# Patient Record
Sex: Female | Born: 1995 | Race: Black or African American | Hispanic: No | Marital: Single | Smoking: Never smoker
Health system: Southern US, Community
[De-identification: ages and names within clinical notes are randomized; demographics above are authoritative.]

## PROBLEM LIST (undated history)

## (undated) DIAGNOSIS — L659 Nonscarring hair loss, unspecified: Secondary | ICD-10-CM

## (undated) DIAGNOSIS — D649 Anemia, unspecified: Secondary | ICD-10-CM

## (undated) DIAGNOSIS — K219 Gastro-esophageal reflux disease without esophagitis: Secondary | ICD-10-CM

---
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
## 2014-02-28 ENCOUNTER — Emergency Department (HOSPITAL_BASED_OUTPATIENT_CLINIC_OR_DEPARTMENT_OTHER)
Admission: EM | Admit: 2014-02-28 | Discharge: 2014-02-28 | Disposition: A | Discharge status: Home / Self Care | Payer: BC Managed Care – PPO | Attending: Emergency Medicine | Admitting: Emergency Medicine

## 2014-02-28 ENCOUNTER — Encounter (HOSPITAL_BASED_OUTPATIENT_CLINIC_OR_DEPARTMENT_OTHER): Payer: Self-pay | Admitting: Emergency Medicine

## 2014-02-28 ENCOUNTER — Emergency Department (HOSPITAL_BASED_OUTPATIENT_CLINIC_OR_DEPARTMENT_OTHER): Payer: BC Managed Care – PPO

## 2014-02-28 DIAGNOSIS — R000 Tachycardia, unspecified: Secondary | ICD-10-CM | POA: Insufficient documentation

## 2014-02-28 DIAGNOSIS — J069 Acute upper respiratory infection, unspecified: Secondary | ICD-10-CM | POA: Insufficient documentation

## 2014-02-28 MED ORDER — HYDROCODONE-HOMATROPINE 5-1.5 MG/5ML PO SYRP: 5.0000 mL | ORAL_SOLUTION | Freq: Four times a day (QID) | ORAL | Status: DC | PRN

## 2014-02-28 NOTE — ED Notes (Signed)
Pt complains of itchy throat, cough with sputum production.  Pt complains of sneezing a lot and drainage was green but not clear.  Pt has been taking mucinex.  Pt reports chills.

## 2014-02-28 NOTE — Discharge Instructions (Signed)
Take hycodan as needed for cough. Refer to attached documents for more information. Drink plenty of fluids and get plenty of rest. Return to the ED with worsening or concerning symptoms.

## 2014-02-28 NOTE — ED Provider Notes (Signed)
CSN: [id number]     Arrival date & time 02/28/14  1024 History   First MD Initiated Contact with Patient 02/28/14 1035     Chief Complaint  Patient presents with  . Cough  . Fever     (Consider location/radiation/quality/duration/timing/severity/associated sxs/prior Treatment) Patient is a 18 y.o. female presenting with cough. The history is provided by the patient. No language interpreter was used.  Cough Cough characteristics:  Productive Sputum characteristics:  Nondescript Severity:  Moderate Onset quality:  Gradual Duration:  2 days Timing:  Constant Progression:  Unchanged Chronicity:  New Smoker: no   Context: upper respiratory infection   Context: not animal exposure, not occupational exposure, not sick contacts, not weather changes and not with activity   Relieved by:  Nothing Worsened by:  Nothing tried Ineffective treatments:  Fluids and decongestant Associated symptoms: chills, fever and sore throat   Associated symptoms: no chest pain and no shortness of breath   Fever:    Duration:  2 days   Timing:  Intermittent   Max temp PTA (F):  Unknown   Temp source:  Subjective Sore throat:    Severity:  Moderate   Onset quality:  Gradual   Duration:  2 days   Timing:  Constant   Progression:  Unchanged Risk factors: no chemical exposure, no recent infection and no recent travel     History reviewed. No pertinent past medical history. History reviewed. No pertinent past surgical history. No family history on file. History  Substance Use Topics  . Smoking status: Never Smoker   . Smokeless tobacco: Not on file  . Alcohol Use: No   OB History   Grav Para Term Preterm Abortions TAB SAB Ect Mult Living                 Review of Systems  Constitutional: Positive for fever and chills. Negative for fatigue.  HENT: Positive for sore throat. Negative for trouble swallowing.   Eyes: Negative for visual disturbance.  Respiratory: Positive for cough. Negative for  shortness of breath.   Cardiovascular: Negative for chest pain and palpitations.  Gastrointestinal: Negative for nausea, vomiting, abdominal pain and diarrhea.  Genitourinary: Negative for dysuria and difficulty urinating.  Musculoskeletal: Negative for arthralgias and neck pain.  Skin: Negative for color change.  Neurological: Negative for dizziness and weakness.  Psychiatric/Behavioral: Negative for dysphoric mood.      Allergies  Ciprofloxacin  Home Medications   Current Outpatient Rx  Name  Route  Sig  Dispense  Refill  . diphenhydrAMINE (BENADRYL) 25 mg capsule   Oral   Take 50 mg by mouth every 6 (six) hours as needed. For itching          BP 118/75  Pulse 105  Temp(Src) 99.1 F (37.3 C) (Oral)  Resp 14  Ht 5\' 4"  (1.626 m)  Wt 131 lb (59.421 kg)  BMI 22.47 kg/m2  SpO2 100%  LMP 02/21/2014 Physical Exam  Nursing note and vitals reviewed. Constitutional: She is oriented to person, place, and time. She appears well-developed and well-nourished. No distress.  HENT:  Head: Normocephalic and atraumatic.  Mouth/Throat: Oropharynx is clear and moist. No oropharyngeal exudate.  Eyes: Conjunctivae and EOM are normal. Pupils are equal, round, and reactive to light.  Neck: Normal range of motion.  Cardiovascular: Normal rate and regular rhythm.  Exam reveals no gallop and no friction rub.   No murmur heard. Pulmonary/Chest: Effort normal and breath sounds normal. She has no wheezes. She has  no rales. She exhibits no tenderness.  Abdominal: Soft. She exhibits no distension. There is no tenderness. There is no rebound.  Musculoskeletal: Normal range of motion.  Neurological: She is alert and oriented to person, place, and time. Coordination normal.  Speech is goal-oriented. Moves limbs without ataxia.   Skin: Skin is warm and dry.  Psychiatric: She has a normal mood and affect. Her behavior is normal.    ED Course  Procedures (including critical care time) Labs  Review Labs Reviewed - No data to display Imaging Review Dg Chest 2 View  02/28/2014   CLINICAL DATA:  Cough and fever  EXAM: CHEST  2 VIEW  COMPARISON:  None.  FINDINGS: The heart size and mediastinal contours are within normal limits. Both lungs are clear. The visualized skeletal structures are unremarkable.  IMPRESSION: No active cardiopulmonary disease.   Electronically Signed   By: Marlan Palau M.D.   On: 02/28/2014 10:57     EKG Interpretation None      MDM   Final diagnoses:  URI (upper respiratory infection)    10:57 AM Patient's chest xray pending. Patient is mildly tachycardic and afebrile.   11:07 AM Patient's chest xray unremarkable for acute changes. Patient likely has URI. Patient will be discharged with hycodan for cough. Patient advised to take ibuprofen or tylenol for body aches and fever. Patient advised to drink plenty of fluids.  Emilia Beck, [place] 03/01/14 [id number]

## 2014-03-01 NOTE — ED Provider Notes (Signed)
Medical screening examination/treatment/procedure(s) were performed by non-physician practitioner and as supervising physician I was immediately available for consultation/collaboration.     Nitara Szczerba, MD 03/01/14 0808 

## 2015-05-14 ENCOUNTER — Emergency Department (HOSPITAL_BASED_OUTPATIENT_CLINIC_OR_DEPARTMENT_OTHER)
Admission: EM | Admit: 2015-05-14 | Discharge: 2015-05-14 | Disposition: A | Discharge status: Home / Self Care | Payer: BLUE CROSS/BLUE SHIELD | Attending: Emergency Medicine | Admitting: Emergency Medicine

## 2015-05-14 ENCOUNTER — Encounter (HOSPITAL_BASED_OUTPATIENT_CLINIC_OR_DEPARTMENT_OTHER): Payer: Self-pay | Admitting: *Deleted

## 2015-05-14 DIAGNOSIS — N3000 Acute cystitis without hematuria: Secondary | ICD-10-CM | POA: Diagnosis not present

## 2015-05-14 DIAGNOSIS — Z3202 Encounter for pregnancy test, result negative: Secondary | ICD-10-CM | POA: Insufficient documentation

## 2015-05-14 DIAGNOSIS — R1084 Generalized abdominal pain: Secondary | ICD-10-CM | POA: Diagnosis present

## 2015-05-14 LAB — URINALYSIS, ROUTINE W REFLEX MICROSCOPIC
Bilirubin Urine: NEGATIVE
GLUCOSE, UA: NEGATIVE mg/dL
Ketones, ur: 15 mg/dL — AB
NITRITE: NEGATIVE
PH: 5.5 (ref 5.0–8.0)
Protein, ur: 30 mg/dL — AB
SPECIFIC GRAVITY, URINE: 1.034 — AB (ref 1.005–1.030)
Urobilinogen, UA: 0.2 mg/dL (ref 0.0–1.0)

## 2015-05-14 LAB — URINE MICROSCOPIC-ADD ON

## 2015-05-14 LAB — PREGNANCY, URINE: Preg Test, Ur: NEGATIVE

## 2015-05-14 MED ORDER — SULFAMETHOXAZOLE-TRIMETHOPRIM 800-160 MG PO TABS: 1.0000 | ORAL_TABLET | Freq: Once | ORAL | Status: DC

## 2015-05-14 MED ORDER — SULFAMETHOXAZOLE-TRIMETHOPRIM 800-160 MG PO TABS: 1.0000 | ORAL_TABLET | Freq: Two times a day (BID) | ORAL | Status: AC

## 2015-05-14 NOTE — Discharge Instructions (Signed)

## 2015-05-14 NOTE — ED Provider Notes (Signed)
CSN: [id number]     Arrival date & time 05/14/15  1154 History   First MD Initiated Contact with Patient 05/14/15 1157     Chief Complaint  Patient presents with  . Abdominal Pain     (Consider location/radiation/quality/duration/timing/severity/associated sxs/prior Treatment) Patient is a 19 y.o. female presenting with abdominal pain. The history is provided by the patient.  Abdominal Pain Pain location:  Generalized Pain quality: cramping   Pain radiates to:  Does not radiate Pain severity:  Mild Onset quality:  Gradual Duration:  1 week Timing:  Intermittent Progression:  Waxing and waning Chronicity:  New Context: not recent illness, not sick contacts and not trauma   Relieved by:  Nothing Worsened by:  Nothing tried Associated symptoms: flatus and vaginal bleeding (spotting, LMP 6 weeks ago. Normally has regular periods)   Associated symptoms: no cough, no diarrhea, no fever, no hematemesis, no hematochezia, no melena, no nausea, no shortness of breath and no vomiting     History reviewed. No pertinent past medical history. History reviewed. No pertinent past surgical history. History reviewed. No pertinent family history. History  Substance Use Topics  . Smoking status: Never Smoker   . Smokeless tobacco: Not on file  . Alcohol Use: No   OB History    No data available     Review of Systems  Constitutional: Negative for fever.  Respiratory: Negative for cough and shortness of breath.   Gastrointestinal: Positive for abdominal pain and flatus. Negative for nausea, vomiting, diarrhea, melena, hematochezia and hematemesis.  Genitourinary: Positive for vaginal bleeding (spotting, LMP 6 weeks ago. Normally has regular periods).  All other systems reviewed and are negative.     Allergies  Ciprofloxacin  Home Medications   Prior to Admission medications   Not on File   BP 127/83 mmHg  Pulse 105  Temp(Src) 99.1 F (37.3 C)  Resp 16  SpO2 100%  LMP  04/07/2015 Physical Exam  Constitutional: She is oriented to person, place, and time. She appears well-developed and well-nourished. No distress.  HENT:  Head: Normocephalic and atraumatic.  Mouth/Throat: Oropharynx is clear and moist.  Eyes: EOM are normal. Pupils are equal, round, and reactive to light.  Neck: Normal range of motion. Neck supple.  Cardiovascular: Normal rate and regular rhythm.  Exam reveals no friction rub.   No murmur heard. Pulmonary/Chest: Effort normal and breath sounds normal. No respiratory distress. She has no wheezes. She has no rales.  Abdominal: Soft. She exhibits no distension. There is no tenderness. There is no rebound.  Musculoskeletal: Normal range of motion. She exhibits no edema.  Neurological: She is alert and oriented to person, place, and time.  Skin: No rash noted. She is not diaphoretic.  Nursing note and vitals reviewed.   ED Course  Procedures (including critical care time) Labs Review Labs Reviewed  URINALYSIS, ROUTINE W REFLEX MICROSCOPIC (NOT AT [facility]) - Abnormal; Notable for the following:    APPearance TURBID (*)    Specific Gravity, Urine 1.034 (*)    Hgb urine dipstick LARGE (*)    Ketones, ur 15 (*)    Protein, ur 30 (*)    Leukocytes, UA LARGE (*)    All other components within normal limits  URINE MICROSCOPIC-ADD ON - Abnormal; Notable for the following:    Squamous Epithelial / LPF MANY (*)    Bacteria, UA MANY (*)    All other components within normal limits  PREGNANCY, URINE    Imaging Review No results found.  EKG Interpretation None      MDM   Final diagnoses:  Acute cystitis without hematuria    43F here with abdominal pain. Mild, diffuse, crampy. Waxing and waning, but is having pain daily. No fevers, vomiting, diarrhea, dysuria. Mild spotting. LMP 6 weeks ago, she is late. Normally has regular periods. AFVSS here. Mild central abdominal pain. No rebound or guarding. I discussed CT benefits vs risks, she  is open to scanning and would like to proceed. I feel this is reasonable.   Urine shows UTI. Discussed holding on scan since found a source. Will plan on antibiotics and f/u.  Elwin MochaBlair Darby Shadwick, MD 05/14/15 1321

## 2015-05-14 NOTE — ED Notes (Signed)
Pt c/o diffuse abd pain x 1 week denies n/v/d

## 2015-05-14 NOTE — ED Notes (Signed)
MD at bedside. 

## 2015-07-04 ENCOUNTER — Emergency Department (HOSPITAL_BASED_OUTPATIENT_CLINIC_OR_DEPARTMENT_OTHER)
Admission: EM | Admit: 2015-07-04 | Discharge: 2015-07-04 | Disposition: A | Discharge status: Home / Self Care | Payer: BLUE CROSS/BLUE SHIELD | Attending: Emergency Medicine | Admitting: Emergency Medicine

## 2015-07-04 ENCOUNTER — Emergency Department (HOSPITAL_BASED_OUTPATIENT_CLINIC_OR_DEPARTMENT_OTHER): Payer: BLUE CROSS/BLUE SHIELD

## 2015-07-04 ENCOUNTER — Inpatient Hospital Stay (HOSPITAL_COMMUNITY): Admission: RE | Admit: 2015-07-04 | Payer: Self-pay | Source: Ambulatory Visit

## 2015-07-04 ENCOUNTER — Encounter (HOSPITAL_BASED_OUTPATIENT_CLINIC_OR_DEPARTMENT_OTHER): Payer: Self-pay

## 2015-07-04 DIAGNOSIS — B373 Candidiasis of vulva and vagina: Secondary | ICD-10-CM | POA: Diagnosis not present

## 2015-07-04 DIAGNOSIS — R1032 Left lower quadrant pain: Secondary | ICD-10-CM

## 2015-07-04 DIAGNOSIS — D500 Iron deficiency anemia secondary to blood loss (chronic): Secondary | ICD-10-CM | POA: Diagnosis not present

## 2015-07-04 DIAGNOSIS — Z3202 Encounter for pregnancy test, result negative: Secondary | ICD-10-CM | POA: Insufficient documentation

## 2015-07-04 DIAGNOSIS — R109 Unspecified abdominal pain: Secondary | ICD-10-CM | POA: Diagnosis present

## 2015-07-04 DIAGNOSIS — B3731 Acute candidiasis of vulva and vagina: Secondary | ICD-10-CM

## 2015-07-04 DIAGNOSIS — N12 Tubulo-interstitial nephritis, not specified as acute or chronic: Secondary | ICD-10-CM

## 2015-07-04 LAB — CBC
HCT: 24.3 % — ABNORMAL LOW (ref 36.0–46.0)
HEMOGLOBIN: 6.6 g/dL — AB (ref 12.0–15.0)
MCH: 17.8 pg — AB (ref 26.0–34.0)
MCHC: 27.2 g/dL — ABNORMAL LOW (ref 30.0–36.0)
MCV: 65.5 fL — ABNORMAL LOW (ref 78.0–100.0)
PLATELETS: 446 10*3/uL — AB (ref 150–400)
RBC: 3.71 MIL/uL — AB (ref 3.87–5.11)
RDW: 18.4 % — AB (ref 11.5–15.5)
WBC: 5.9 10*3/uL (ref 4.0–10.5)

## 2015-07-04 LAB — COMPREHENSIVE METABOLIC PANEL
ALK PHOS: 47 U/L (ref 38–126)
ALT: 25 U/L (ref 14–54)
ANION GAP: 6 (ref 5–15)
AST: 30 U/L (ref 15–41)
Albumin: 3.7 g/dL (ref 3.5–5.0)
BILIRUBIN TOTAL: 0.4 mg/dL (ref 0.3–1.2)
BUN: 13 mg/dL (ref 6–20)
CO2: 23 mmol/L (ref 22–32)
Calcium: 8.6 mg/dL — ABNORMAL LOW (ref 8.9–10.3)
Chloride: 109 mmol/L (ref 101–111)
Creatinine, Ser: 0.75 mg/dL (ref 0.44–1.00)
Glucose, Bld: 126 mg/dL — ABNORMAL HIGH (ref 65–99)
POTASSIUM: 3.3 mmol/L — AB (ref 3.5–5.1)
Sodium: 138 mmol/L (ref 135–145)
Total Protein: 7 g/dL (ref 6.5–8.1)

## 2015-07-04 LAB — URINALYSIS, ROUTINE W REFLEX MICROSCOPIC
BILIRUBIN URINE: NEGATIVE
GLUCOSE, UA: NEGATIVE mg/dL
Ketones, ur: NEGATIVE mg/dL
Nitrite: NEGATIVE
PROTEIN: 30 mg/dL — AB
SPECIFIC GRAVITY, URINE: 1.026 (ref 1.005–1.030)
Urobilinogen, UA: 0.2 mg/dL (ref 0.0–1.0)
pH: 5 (ref 5.0–8.0)

## 2015-07-04 LAB — URINE MICROSCOPIC-ADD ON

## 2015-07-04 LAB — PREGNANCY, URINE: PREG TEST UR: NEGATIVE

## 2015-07-04 LAB — LIPASE, BLOOD: Lipase: 19 U/L — ABNORMAL LOW (ref 22–51)

## 2015-07-04 LAB — WET PREP, GENITAL: Trich, Wet Prep: NONE SEEN

## 2015-07-04 MED ORDER — ONDANSETRON HCL 4 MG/2ML IJ SOLN
4.0000 mg | Freq: Once | INTRAMUSCULAR | Status: AC
  Administered 2015-07-04: 4 mg via INTRAVENOUS
  Filled 2015-07-04: qty 2

## 2015-07-04 MED ORDER — CEFTRIAXONE SODIUM 1 G IJ SOLR
INTRAMUSCULAR | Status: AC
  Filled 2015-07-04: qty 10

## 2015-07-04 MED ORDER — FENTANYL CITRATE (PF) 100 MCG/2ML IJ SOLN
100.0000 ug | Freq: Once | INTRAMUSCULAR | Status: AC
  Administered 2015-07-04: 100 ug via INTRAVENOUS
  Filled 2015-07-04: qty 2

## 2015-07-04 MED ORDER — DEXTROSE 5 % IV SOLN
1.0000 g | Freq: Once | INTRAVENOUS | Status: AC
  Administered 2015-07-04: 1 g via INTRAVENOUS

## 2015-07-04 MED ORDER — FLUCONAZOLE 150 MG PO TABS: ORAL_TABLET | ORAL | Status: DC

## 2015-07-04 MED ORDER — FERROUS SULFATE 325 (65 FE) MG PO TABS: 325.0000 mg | ORAL_TABLET | Freq: Every day | ORAL | Status: DC

## 2015-07-04 MED ORDER — FLUCONAZOLE 50 MG PO TABS
150.0000 mg | ORAL_TABLET | Freq: Once | ORAL | Status: AC
  Administered 2015-07-04: 150 mg via ORAL
  Filled 2015-07-04 (×2): qty 1

## 2015-07-04 MED ORDER — CEPHALEXIN 500 MG PO CAPS: 500.0000 mg | ORAL_CAPSULE | Freq: Three times a day (TID) | ORAL | Status: DC

## 2015-07-04 NOTE — ED Notes (Signed)
Pt c/o lt sided abdominal pain x39mins with vomiting x3;

## 2015-07-04 NOTE — ED Provider Notes (Signed)
CSN: [id number]     Arrival date & time 07/04/15  0317 History   First MD Initiated Contact with Patient 07/04/15 0445     Chief Complaint  Patient presents with  . Abdominal Pain     (Consider location/radiation/quality/duration/timing/severity/associated sxs/prior Treatment) HPI  This is a 19 year old female who awoke about 3 AM with severe, sharp left-sided abdominal pain. It is been associated with several episodes of emesis. There is some change with movement or palpation but not with breathing. She denies fever, chills, diarrhea, shortness of breath, dysuria, hematuria, vaginal bleeding or vaginal discharge. She does have a history of heavy periods and states she eats a lot of ice. She is sexually active but does not use any form of birth control other than condoms.  History reviewed. No pertinent past medical history. History reviewed. No pertinent past surgical history. No family history on file. History  Substance Use Topics  . Smoking status: Never Smoker   . Smokeless tobacco: Not on file  . Alcohol Use: No   OB History    No data available     Review of Systems  All other systems reviewed and are negative.   Allergies  Ciprofloxacin  Home Medications   Prior to Admission medications   Not on File   BP 116/63 mmHg  Pulse 74  Temp(Src) 98 F (36.7 C) (Oral)  Resp 18  Ht 5\' 5"  (1.651 m)  Wt 130 lb (58.968 kg)  BMI 21.63 kg/m2  SpO2 98%  LMP 06/20/2015   Physical Exam General: Well-developed, well-nourished female in no acute distress; appearance consistent with age of record HENT: normocephalic; atraumatic Eyes: pupils equal, round and reactive to light; extraocular muscles intact Neck: supple Heart: regular rate and rhythm Lungs: clear to auscultation bilaterally Abdomen: soft; nondistended; equivocal left lower quadrant; no masses or hepatosplenomegaly; bowel sounds present GU: Normal external genitalia; no vaginal bleeding; mucoid discharge per  cervical os; no cervical motion tenderness; no adnexal tenderness Extremities: No deformity; full range of motion; pulses normal Neurologic: Awake, alert and oriented; motor function intact in all extremities and symmetric; no facial droop Skin: Warm and dry Psychiatric: Normal mood and affect    ED Course  Procedures (including critical care time)   MDM   Nursing notes and vitals signs, including pulse oximetry, reviewed.  Summary of this visit's results, reviewed by myself:  Labs:  Results for orders placed or performed during the hospital encounter of 07/04/15 (from the past 24 hour(s))  Lipase, blood     Status: Abnormal   Collection Time: 07/04/15  4:00 AM  Result Value Ref Range   Lipase 19 (L) 22 - 51 U/L  Comprehensive metabolic panel     Status: Abnormal   Collection Time: 07/04/15  4:00 AM  Result Value Ref Range   Sodium 138 135 - 145 mmol/L   Potassium 3.3 (L) 3.5 - 5.1 mmol/L   Chloride 109 101 - 111 mmol/L   CO2 23 22 - 32 mmol/L   Glucose, Bld 126 (H) 65 - 99 mg/dL   BUN 13 6 - 20 mg/dL   Creatinine, Ser 7.82 0.44 - 1.00 mg/dL   Calcium 8.6 (L) 8.9 - 10.3 mg/dL   Total Protein 7.0 6.5 - 8.1 g/dL   Albumin 3.7 3.5 - 5.0 g/dL   AST 30 15 - 41 U/L   ALT 25 14 - 54 U/L   Alkaline Phosphatase 47 38 - 126 U/L   Total Bilirubin 0.4 0.3 - 1.2 mg/dL  GFR calc non Af Amer >60 >60 mL/min   GFR calc Af Amer >60 >60 mL/min   Anion gap 6 5 - 15  CBC     Status: Abnormal   Collection Time: 07/04/15  4:00 AM  Result Value Ref Range   WBC 5.9 4.0 - 10.5 K/uL   RBC 3.71 (L) 3.87 - 5.11 MIL/uL   Hemoglobin 6.6 (LL) 12.0 - 15.0 g/dL   HCT 29.5 (L) 62.1 - 30.8 %   MCV 65.5 (L) 78.0 - 100.0 fL   MCH 17.8 (L) 26.0 - 34.0 pg   MCHC 27.2 (L) 30.0 - 36.0 g/dL   RDW 65.7 (H) 84.6 - 96.2 %   Platelets 446 (H) 150 - 400 K/uL  Urinalysis, Routine w reflex microscopic (not at [facility])     Status: Abnormal   Collection Time: 07/04/15  4:00 AM  Result Value Ref Range   Color,  Urine YELLOW YELLOW   APPearance TURBID (A) CLEAR   Specific Gravity, Urine 1.026 1.005 - 1.030   pH 5.0 5.0 - 8.0   Glucose, UA NEGATIVE NEGATIVE mg/dL   Hgb urine dipstick LARGE (A) NEGATIVE   Bilirubin Urine NEGATIVE NEGATIVE   Ketones, ur NEGATIVE NEGATIVE mg/dL   Protein, ur 30 (A) NEGATIVE mg/dL   Urobilinogen, UA 0.2 0.0 - 1.0 mg/dL   Nitrite NEGATIVE NEGATIVE   Leukocytes, UA MODERATE (A) NEGATIVE  Pregnancy, urine     Status: None   Collection Time: 07/04/15  4:00 AM  Result Value Ref Range   Preg Test, Ur NEGATIVE NEGATIVE  Urine microscopic-add on     Status: Abnormal   Collection Time: 07/04/15  4:00 AM  Result Value Ref Range   Squamous Epithelial / LPF MANY (A) RARE   WBC, UA 21-50 <3 WBC/hpf   RBC / HPF 7-10 <3 RBC/hpf   Bacteria, UA MANY (A) RARE   Urine-Other MUCOUS PRESENT   Wet prep, genital     Status: Abnormal   Collection Time: 07/04/15  5:12 AM  Result Value Ref Range   Yeast Wet Prep HPF POC FEW (A) NONE SEEN   Trich, Wet Prep NONE SEEN NONE SEEN   Clue Cells Wet Prep HPF POC FEW (A) NONE SEEN   WBC, Wet Prep HPF POC TOO NUMEROUS TO COUNT (A) NONE SEEN    Imaging Studies: Ct Renal Stone Study  07/04/2015   CLINICAL DATA:  Initial evaluation for acute left-sided abdominal pain, nausea, vomiting, hematuria.  EXAM: CT ABDOMEN AND PELVIS WITHOUT CONTRAST  TECHNIQUE: Multidetector CT imaging of the abdomen and pelvis was performed following the standard protocol without IV contrast.  COMPARISON:  None.  FINDINGS: Visualized lung bases are clear. Diffusely decreased density within the cardiac blood pole, suggesting anemia.  Liver demonstrates a normal unenhanced appearance. Gallbladder within normal limits. No biliary dilatation. Spleen, adrenal glands, and pancreas demonstrate a normal unenhanced appearance.  Kidneys are equal in size without evidence of nephrolithiasis or hydronephrosis. Faint punctate hyperdensity within the renal papilla favored to be related  to dehydration. No radiopaque calculi seen along the course of either renal collecting system. There is a 4 mm calcification along the posterior aspect of the bladder, which may reflect a passed stone (Series 2, image 66). This is not entirely certain, and this may reflect an adjacent vascular phlebolith.  Bladder within normal limits. No evidence for bowel obstruction. No acute inflammatory changes seen about the bowels. Appendix is normal.  Bladder within normal limits. Fluid present within the  endometrial canal. Uterus and ovaries are otherwise within normal limits.  No free air or fluid.  No adenopathy.  No acute osseous abnormality. No worrisome lytic or blastic osseous lesions.  IMPRESSION: 1. Single 4 mm calcific density layering along the right posterior aspect of the bladder lumen. It is unclear whether this reflects a stone layering within the bladder lumen or an adjacent vascular phlebolith. There is no significant hydronephrosis or hydroureter within either renal collecting system, which would argue against this being a recently passed stone. Correlation with urinalysis and symptomatology recommended. 2. No other definite radiopaque calculi identified. 3. No other acute intra-abdominal or pelvic process. 4. Decreased density within the cardiac blood pool, suggesting anemia.   Electronically Signed   By: Rise Mu M.D.   On: 07/04/2015 06:20   6:31 AM Suspect early pyelonephritis. Rocephin given IV in ED. Diflucan given for vaginal candidiasis. Will start on iron and refer to [facility] clinic for treatment of her heavy menses and consequent microcytic anemia.      Paula Libra, MD 07/04/15 [phone]

## 2015-07-05 LAB — GC/CHLAMYDIA PROBE AMP (~~LOC~~) NOT AT ARMC
CHLAMYDIA, DNA PROBE: POSITIVE — AB
NEISSERIA GONORRHEA: NEGATIVE

## 2015-07-05 LAB — HIV ANTIBODY (ROUTINE TESTING W REFLEX): HIV Screen 4th Generation wRfx: NONREACTIVE

## 2015-07-06 LAB — URINE CULTURE

## 2015-07-08 ENCOUNTER — Telehealth: Payer: Self-pay | Admitting: *Deleted

## 2015-07-08 NOTE — ED Notes (Signed)
(+)  urine culture, treated with Cephalexin, OK per pharm 

## 2015-08-29 ENCOUNTER — Emergency Department (HOSPITAL_BASED_OUTPATIENT_CLINIC_OR_DEPARTMENT_OTHER)
Admission: EM | Admit: 2015-08-29 | Discharge: 2015-08-29 | Disposition: A | Discharge status: Home / Self Care | Payer: BLUE CROSS/BLUE SHIELD | Attending: Emergency Medicine | Admitting: Emergency Medicine

## 2015-08-29 ENCOUNTER — Encounter (HOSPITAL_BASED_OUTPATIENT_CLINIC_OR_DEPARTMENT_OTHER): Payer: Self-pay | Admitting: Emergency Medicine

## 2015-08-29 DIAGNOSIS — Z862 Personal history of diseases of the blood and blood-forming organs and certain disorders involving the immune mechanism: Secondary | ICD-10-CM | POA: Insufficient documentation

## 2015-08-29 DIAGNOSIS — J069 Acute upper respiratory infection, unspecified: Secondary | ICD-10-CM | POA: Diagnosis not present

## 2015-08-29 DIAGNOSIS — M791 Myalgia: Secondary | ICD-10-CM | POA: Diagnosis not present

## 2015-08-29 DIAGNOSIS — J309 Allergic rhinitis, unspecified: Secondary | ICD-10-CM | POA: Insufficient documentation

## 2015-08-29 DIAGNOSIS — R0981 Nasal congestion: Secondary | ICD-10-CM | POA: Diagnosis present

## 2015-08-29 HISTORY — DX: Anemia, unspecified: D64.9

## 2015-08-29 MED ORDER — NAPROXEN 250 MG PO TABS: 250.0000 mg | ORAL_TABLET | Freq: Two times a day (BID) | ORAL | Status: DC

## 2015-08-29 MED ORDER — CETIRIZINE HCL 10 MG PO TABS: 10.0000 mg | ORAL_TABLET | Freq: Every day | ORAL | Status: DC

## 2015-08-29 MED ORDER — BENZONATATE 100 MG PO CAPS: 100.0000 mg | ORAL_CAPSULE | Freq: Three times a day (TID) | ORAL | Status: DC

## 2015-08-29 MED ORDER — FLUTICASONE PROPIONATE 50 MCG/ACT NA SUSP: 2.0000 | Freq: Every day | NASAL | Status: DC

## 2015-08-29 NOTE — ED Notes (Signed)
PA at bedside.

## 2015-08-29 NOTE — Discharge Instructions (Signed)
Upper Respiratory Infection, Adult An upper respiratory infection (URI) is also sometimes known as the common cold. The upper respiratory tract includes the nose, sinuses, throat, trachea, and bronchi. Bronchi are the airways leading to the lungs. Most people improve within 1 week, but symptoms can last up to 2 weeks. A residual cough may last even longer.  CAUSES Many different viruses can infect the tissues lining the upper respiratory tract. The tissues become irritated and inflamed and often become very moist. Mucus production is also common. A cold is contagious. You can easily spread the virus to others by oral contact. This includes kissing, sharing a glass, coughing, or sneezing. Touching your mouth or nose and then touching a surface, which is then touched by another person, can also spread the virus. SYMPTOMS  Symptoms typically develop 1 to 3 days after you come in contact with a cold virus. Symptoms vary from person to person. They may include:  Runny nose.  Sneezing.  Nasal congestion.  Sinus irritation.  Sore throat.  Loss of voice (laryngitis).  Cough.  Fatigue.  Muscle aches.  Loss of appetite.  Headache.  Low-grade fever. DIAGNOSIS  You might diagnose your own cold based on familiar symptoms, since most people get a cold 2 to 3 times a year. Your caregiver can confirm this based on your exam. Most importantly, your caregiver can check that your symptoms are not due to another disease such as strep throat, sinusitis, pneumonia, asthma, or epiglottitis. Blood tests, throat tests, and X-rays are not necessary to diagnose a common cold, but they may sometimes be helpful in excluding other more serious diseases. Your caregiver will decide if any further tests are required. RISKS AND COMPLICATIONS  You may be at risk for a more severe case of the common cold if you smoke cigarettes, have chronic heart disease (such as heart failure) or lung disease (such as asthma), or if  you have a weakened immune system. The very young and very old are also at risk for more serious infections. Bacterial sinusitis, middle ear infections, and bacterial pneumonia can complicate the common cold. The common cold can worsen asthma and chronic obstructive pulmonary disease (COPD). Sometimes, these complications can require emergency medical care and may be life-threatening. PREVENTION  The best way to protect against getting a cold is to practice good hygiene. Avoid oral or hand contact with people with cold symptoms. Wash your hands often if contact occurs. There is no clear evidence that vitamin C, vitamin E, echinacea, or exercise reduces the chance of developing a cold. However, it is always recommended to get plenty of rest and practice good nutrition. TREATMENT  Treatment is directed at relieving symptoms. There is no cure. Antibiotics are not effective, because the infection is caused by a virus, not by bacteria. Treatment may include:  Increased fluid intake. Sports drinks offer valuable electrolytes, sugars, and fluids.  Breathing heated mist or steam (vaporizer or shower).  Eating chicken soup or other clear broths, and maintaining good nutrition.  Getting plenty of rest.  Using gargles or lozenges for comfort.  Controlling fevers with ibuprofen or acetaminophen as directed by your caregiver.  Increasing usage of your inhaler if you have asthma. Zinc gel and zinc lozenges, taken in the first 24 hours of the common cold, can shorten the duration and lessen the severity of symptoms. Pain medicines may help with fever, muscle aches, and throat pain. A variety of non-prescription medicines are available to treat congestion and runny nose. Your caregiver   can make recommendations and may suggest nasal or lung inhalers for other symptoms.  HOME CARE INSTRUCTIONS   Only take over-the-counter or prescription medicines for pain, discomfort, or fever as directed by your  caregiver.  Use a warm mist humidifier or inhale steam from a shower to increase air moisture. This may keep secretions moist and make it easier to breathe.  Drink enough water and fluids to keep your urine clear or pale yellow.  Rest as needed.  Return to work when your temperature has returned to normal or as your caregiver advises. You may need to stay home longer to avoid infecting others. You can also use a face mask and careful hand washing to prevent spread of the virus. SEEK MEDICAL CARE IF:   After the first few days, you feel you are getting worse rather than better.  You need your caregiver's advice about medicines to control symptoms.  You develop chills, worsening shortness of breath, or brown or red sputum. These may be signs of pneumonia.  You develop yellow or brown nasal discharge or pain in the face, especially when you bend forward. These may be signs of sinusitis.  You develop a fever, swollen neck glands, pain with swallowing, or white areas in the back of your throat. These may be signs of strep throat. SEEK IMMEDIATE MEDICAL CARE IF:   You have a fever.  You develop severe or persistent headache, ear pain, sinus pain, or chest pain.  You develop wheezing, a prolonged cough, cough up blood, or have a change in your usual mucus (if you have chronic lung disease).  You develop sore muscles or a stiff neck. Document Released: 05/21/2001 Document Revised: 02/17/2012 Document Reviewed: 03/02/2014 [employer] Patient Information 2015 [employer]. This information is not intended to replace advice given to you by your health care provider. Make sure you discuss any questions you have with your health care provider. Allergic Rhinitis Allergic rhinitis is when the mucous membranes in the nose respond to allergens. Allergens are particles in the air that cause your body to have an allergic reaction. This causes you to release allergic antibodies. Through a chain of  events, these eventually cause you to release histamine into the blood stream. Although meant to protect the body, it is this release of histamine that causes your discomfort, such as frequent sneezing, congestion, and an itchy, runny nose.  CAUSES  Seasonal allergic rhinitis (hay fever) is caused by pollen allergens that may come from grasses, trees, and weeds. Year-round allergic rhinitis (perennial allergic rhinitis) is caused by allergens such as house dust mites, pet dander, and mold spores.  SYMPTOMS   Nasal stuffiness (congestion).  Itchy, runny nose with sneezing and tearing of the eyes. DIAGNOSIS  Your health care provider can help you determine the allergen or allergens that trigger your symptoms. If you and your health care provider are unable to determine the allergen, skin or blood testing may be used. TREATMENT  Allergic rhinitis does not have a cure, but it can be controlled by:  Medicines and allergy shots (immunotherapy).  Avoiding the allergen. Hay fever may often be treated with antihistamines in pill or nasal spray forms. Antihistamines block the effects of histamine. There are over-the-counter medicines that may help with nasal congestion and swelling around the eyes. Check with your health care provider before taking or giving this medicine.  If avoiding the allergen or the medicine prescribed do not work, there are many new medicines your health care provider can prescribe.   Stronger medicine may be used if initial measures are ineffective. Desensitizing injections can be used if medicine and avoidance does not work. Desensitization is when a patient is given ongoing shots until the body becomes less sensitive to the allergen. Make sure you follow up with your health care provider if problems continue. HOME CARE INSTRUCTIONS It is not possible to completely avoid allergens, but you can reduce your symptoms by taking steps to limit your exposure to them. It helps to know  exactly what you are allergic to so that you can avoid your specific triggers. SEEK MEDICAL CARE IF:   You have a fever.  You develop a cough that does not stop easily (persistent).  You have shortness of breath.  You start wheezing.  Symptoms interfere with normal daily activities. Document Released: 08/20/2001 Document Revised: 11/30/2013 Document Reviewed: 08/02/2013 [employer] Patient Information 2015 [employer]. This information is not intended to replace advice given to you by your health care provider. Make sure you discuss any questions you have with your health care provider.  

## 2015-08-29 NOTE — ED Provider Notes (Signed)
CSN: [id number]     Arrival date & time 08/29/15  1623 History   First MD Initiated Contact with Patient 08/29/15 1711     Chief Complaint  Patient presents with  . Nasal Congestion    Diamond Williams is a 19 y.o. female who is otherwise healthy presents to the emergency department complaining of nasal congestion, subjective fever, runny nose, sinus pressure, postnasal drip, dry cough and scratchy throat ongoing for the past 5 days. Patient reports she has had postnasal drip which she feels is making her have an intermittent dry cough. She reports taking cold and flu medicine yesterday with some relief. She denies sick contacts. The patient denies shortness of breath, wheezing, abdominal pain, nausea, vomiting, neck pain, neck stiffness, headache, or rashes.   (Consider location/radiation/quality/duration/timing/severity/associated sxs/prior Treatment) HPI  Past Medical History  Diagnosis Date  . Anemia    History reviewed. No pertinent past surgical history. History reviewed. No pertinent family history. Social History  Substance Use Topics  . Smoking status: Never Smoker   . Smokeless tobacco: None  . Alcohol Use: No   OB History    No data available     Review of Systems  Constitutional: Positive for fever (subjective ).  HENT: Positive for postnasal drip, rhinorrhea, sinus pressure and sneezing. Negative for ear discharge, ear pain, facial swelling, nosebleeds, sore throat, trouble swallowing and voice change.   Eyes: Negative for pain, redness and visual disturbance.  Respiratory: Positive for cough. Negative for shortness of breath and wheezing.   Cardiovascular: Negative for chest pain.  Gastrointestinal: Negative for nausea, vomiting and abdominal pain.  Musculoskeletal: Positive for myalgias. Negative for neck pain and neck stiffness.  Skin: Negative for rash and wound.  Neurological: Negative for dizziness, light-headedness and headaches.      Allergies   Ciprofloxacin  Home Medications   Prior to Admission medications   Medication Sig Start Date End Date Taking? Authorizing Provider  DM-Doxylamine-Acetaminophen (TYLENOL COUGH/SORE THROAT) 15-6.25-500 MG/15ML MISC Take by mouth.   Yes Historical Provider, MD  benzonatate (TESSALON) 100 MG capsule Take 1 capsule (100 mg total) by mouth every 8 (eight) hours. 08/29/15   Everlene Farrier, PA-C  cetirizine (ZYRTEC ALLERGY) 10 MG tablet Take 1 tablet (10 mg total) by mouth daily. 08/29/15   Everlene Farrier, PA-C  fluticasone (FLONASE) 50 MCG/ACT nasal spray Place 2 sprays into both nostrils daily. 08/29/15   Everlene Farrier, PA-C  naproxen (NAPROSYN) 250 MG tablet Take 1 tablet (250 mg total) by mouth 2 (two) times daily with a meal. 08/29/15   Everlene Farrier, PA-C   BP 147/82 mmHg  Pulse 98  Temp(Src) 98.8 F (37.1 C) (Oral)  Resp 18  Ht  (1.6 m)  Wt 149 lb (67.586 kg)  BMI 26.40 kg/m2  SpO2 100%  LMP 08/15/2015 Physical Exam  Constitutional: She appears well-developed and well-nourished. No distress.  Nontoxic appearing.  HENT:  Head: Normocephalic and atraumatic.  Right Ear: External ear normal.  Left Ear: External ear normal.  Mouth/Throat: Oropharynx is clear and moist. No oropharyngeal exudate.  Boggy nasal turbinates bilaterally. Mild maxillary sinus tenderness to palpation. No frontal sinus tenderness. Oropharynx is clear with no tonsillar hypertrophy or exudates. Uvula is midline without edema. Bilateral tympanic membranes are pearly-gray without erythema or loss of landmarks.   Eyes: Conjunctivae are normal. Pupils are equal, round, and reactive to light. Right eye exhibits no discharge. Left eye exhibits no discharge.  Neck: Normal range of motion. Neck supple. No JVD present.  No tracheal deviation present.  No cervical lymphadenopathy.  Cardiovascular: Normal rate, regular rhythm, normal heart sounds and intact distal pulses.   Pulmonary/Chest: Effort normal and breath sounds  normal. No respiratory distress. She has no wheezes. She has no rales.  Lungs are clear to auscultation bilaterally.  Abdominal: Soft. There is no tenderness. There is no guarding.  Lymphadenopathy:    She has no cervical adenopathy.  Neurological: She is alert. Coordination normal.  Skin: Skin is warm and dry. No rash noted. She is not diaphoretic. No erythema. No pallor.  Psychiatric: She has a normal mood and affect. Her behavior is normal.  Nursing note and vitals reviewed.   ED Course  Procedures (including critical care time) Labs Review Labs Reviewed - No data to display  Imaging Review No results found.    EKG Interpretation None      Filed Vitals:   08/29/15 1628 08/29/15 1750  BP: 147/82 122/83  Pulse: 98 77  Temp: 98.8 F (37.1 C)   TempSrc: Oral   Resp: 18 16  Height:  (1.6 m)   Weight: 149 lb (67.586 kg)   SpO2: 100% 100%     MDM   Meds given in ED:  Medications - No data to display  New Prescriptions   BENZONATATE (TESSALON) 100 MG CAPSULE    Take 1 capsule (100 mg total) by mouth every 8 (eight) hours.   CETIRIZINE (ZYRTEC ALLERGY) 10 MG TABLET    Take 1 tablet (10 mg total) by mouth daily.   FLUTICASONE (FLONASE) 50 MCG/ACT NASAL SPRAY    Place 2 sprays into both nostrils daily.   NAPROXEN (NAPROSYN) 250 MG TABLET    Take 1 tablet (250 mg total) by mouth 2 (two) times daily with a meal.    Final diagnoses:  URI (upper respiratory infection)  Allergic rhinitis, unspecified allergic rhinitis type   This is a 19 y.o. female who is otherwise healthy presents to the emergency department complaining of nasal congestion, subjective fever, runny nose, sinus pressure, postnasal drip, dry cough and scratchy throat ongoing for the past 5 days. On exam the patient is afebrile nontoxic appearing. She is not tachypneic, tachycardic or hypoxic. She is boggy nasal terminus bilaterally with mild maxillary sinus tenderness to palpation. Oropharynx is clear and  moist without tonsillar hypertrophy or exudates. Lungs are clear to auscultation bilaterally. This patient appears to have an upper respiratory infection and as her symptoms have been ongoing for only 5 days, will not prescribe an antibiotic at this time. We'll discharge with prescriptions for fluticasone nasal spray, Zyrtec, Tessalon Perles and an naproxen. Encouraged lots of fluid and rest and strict return precautions. I advised the patient to follow-up with their primary care provider this week. I advised the patient to return to the emergency department with new or worsening symptoms or new concerns. The patient verbalized understanding and agreement with plan.      Everlene Farrier, PA-C 08/29/15 1758  Doug Sou, MD 08/29/15 [phone]

## 2015-08-29 NOTE — ED Notes (Signed)
C/o cough, cold, congestion that started last wed, reports fevers at home

## 2015-11-29 ENCOUNTER — Emergency Department (HOSPITAL_BASED_OUTPATIENT_CLINIC_OR_DEPARTMENT_OTHER)
Admission: EM | Admit: 2015-11-29 | Discharge: 2015-11-29 | Disposition: A | Discharge status: Home / Self Care | Payer: BLUE CROSS/BLUE SHIELD | Attending: Emergency Medicine | Admitting: Emergency Medicine

## 2015-11-29 ENCOUNTER — Encounter (HOSPITAL_BASED_OUTPATIENT_CLINIC_OR_DEPARTMENT_OTHER): Payer: Self-pay | Admitting: *Deleted

## 2015-11-29 DIAGNOSIS — R52 Pain, unspecified: Secondary | ICD-10-CM | POA: Diagnosis not present

## 2015-11-29 DIAGNOSIS — Z791 Long term (current) use of non-steroidal anti-inflammatories (NSAID): Secondary | ICD-10-CM | POA: Insufficient documentation

## 2015-11-29 DIAGNOSIS — Z79899 Other long term (current) drug therapy: Secondary | ICD-10-CM | POA: Diagnosis not present

## 2015-11-29 DIAGNOSIS — R000 Tachycardia, unspecified: Secondary | ICD-10-CM | POA: Insufficient documentation

## 2015-11-29 DIAGNOSIS — R0981 Nasal congestion: Secondary | ICD-10-CM

## 2015-11-29 DIAGNOSIS — Z7951 Long term (current) use of inhaled steroids: Secondary | ICD-10-CM | POA: Diagnosis not present

## 2015-11-29 DIAGNOSIS — R6883 Chills (without fever): Secondary | ICD-10-CM | POA: Diagnosis present

## 2015-11-29 DIAGNOSIS — J3489 Other specified disorders of nose and nasal sinuses: Secondary | ICD-10-CM | POA: Diagnosis not present

## 2015-11-29 DIAGNOSIS — D649 Anemia, unspecified: Secondary | ICD-10-CM | POA: Diagnosis not present

## 2015-11-29 MED ORDER — TRIAMCINOLONE ACETONIDE 55 MCG/ACT NA AERO: 1.0000 | INHALATION_SPRAY | Freq: Two times a day (BID) | NASAL | Status: DC

## 2015-11-29 MED ORDER — DM-GUAIFENESIN ER 30-600 MG PO TB12: 1.0000 | ORAL_TABLET | Freq: Two times a day (BID) | ORAL | Status: AC

## 2015-11-29 NOTE — ED Notes (Signed)
Patient states she woke up yesterday with sinus congestion and drainage.  Symptoms are associated with headache, body aches, chills, and nonproductive cough.

## 2015-11-29 NOTE — ED Provider Notes (Signed)
CSN: [id number]     Arrival date & time 11/29/15  0718 History   First MD Initiated Contact with Patient 11/29/15 [phone]     Chief Complaint  Patient presents with  . URI    HPI  Well-appearing female presents with one day of sinus pressure, congestion, chills, aches. Patient was well prior to the onset of symptoms. Since onset yesterday patient has used Benadryl, with minimal change in her symptoms. She denies objective fever, chest pain, syncope, nausea, vomiting, diarrhea, intolerance of oral intake. Patient denies potential medical problems beyond seasonal allergies.   Past Medical History  Diagnosis Date  . Anemia    History reviewed. No pertinent past surgical history. No family history on file. Social History  Substance Use Topics  . Smoking status: Never Smoker   . Smokeless tobacco: None  . Alcohol Use: No   OB History    No data available     Review of Systems  Constitutional: Negative for fever.  Respiratory: Negative for shortness of breath.   Cardiovascular: Negative for chest pain.  Musculoskeletal:       Negative aside from HPI  Skin:       Negative aside from HPI  Allergic/Immunologic: Negative for immunocompromised state.  Neurological: Negative for weakness.      Allergies  Ciprofloxacin  Home Medications   Prior to Admission medications   Medication Sig Start Date End Date Taking? Authorizing Provider  ferrous fumarate (HEMOCYTE - 106 MG FE) 325 (106 FE) MG TABS tablet Take 1 tablet by mouth.   Yes Historical Provider, MD  fluticasone (FLONASE) 50 MCG/ACT nasal spray Place 2 sprays into both nostrils daily. 08/29/15  Yes Everlene Farrier, PA-C  PRESCRIPTION MEDICATION Birth Control pills   Yes Historical Provider, MD  benzonatate (TESSALON) 100 MG capsule Take 1 capsule (100 mg total) by mouth every 8 (eight) hours. 08/29/15   Everlene Farrier, PA-C  cetirizine (ZYRTEC ALLERGY) 10 MG tablet Take 1 tablet (10 mg total) by mouth daily. 08/29/15    Everlene Farrier, PA-C  dextromethorphan-guaiFENesin (MUCINEX DM) 30-600 MG 12hr tablet Take 1 tablet by mouth 2 (two) times daily. 11/29/15 12/03/15  Gerhard Munch, MD  naproxen (NAPROSYN) 250 MG tablet Take 1 tablet (250 mg total) by mouth 2 (two) times daily with a meal. 08/29/15   Everlene Farrier, PA-C  triamcinolone (NASACORT AQ) 55 MCG/ACT AERO nasal inhaler Place 1 spray into the nose 2 (two) times daily. 11/29/15 12/03/15  Gerhard Munch, MD   BP 123/82 mmHg  Pulse 108  Temp(Src) 99.6 F (37.6 C) (Oral)  Resp 20  Ht  (1.676 m)  Wt 138 lb (62.596 kg)  BMI 22.28 kg/m2  SpO2 100%  LMP 11/15/2015 Physical Exam  Constitutional: She is oriented to person, place, and time. She appears well-developed and well-nourished. No distress.  HENT:  Head: Normocephalic and atraumatic.  Mouth/Throat: Uvula is midline, oropharynx is clear and moist and mucous membranes are normal.  Eyes: Conjunctivae and EOM are normal.  Cardiovascular: Regular rhythm.  Tachycardia present.   Pulmonary/Chest: Effort normal. No stridor. No respiratory distress. She has no decreased breath sounds. She has no wheezes. She has no rhonchi.  Abdominal: She exhibits no distension.  Musculoskeletal: She exhibits no edema.  Lymphadenopathy:       Right cervical: No superficial cervical, no deep cervical and no posterior cervical adenopathy present.      Left cervical: No superficial cervical, no deep cervical and no posterior cervical adenopathy present.  Neurological: She  is alert and oriented to person, place, and time. No cranial nerve deficit.  Skin: Skin is warm and dry.  Psychiatric: She has a normal mood and affect.  Nursing note and vitals reviewed.   ED Course  Procedures (including critical care time)   MDM   Final diagnoses:  Sinus congestion  Body aches   this well-appearing female resents with one day of sinus congestion. History patient awake, alert, minimally febrile, with slight  tachycardia. Patient in no distress, with no evidence for bacteremia, sepsis, pneumonia. Patient started on a course of inhaled decongested, oral cold medicine, discharged to follow-up with primary care.  Gerhard Munchobert Famous Eisenhardt, MD 11/29/15 [phone]

## 2015-11-29 NOTE — Discharge Instructions (Signed)
As discussed, your evaluation today has been largely reassuring.  But, it is important that you monitor your condition carefully, and do not hesitate to return to the ED if you develop new, or concerning changes in your condition.  Her symptoms are likely due to a virus, with inflammation of your sinuses.  Please use the prescribed medication as directed.  Please follow-up with your physician for appropriate ongoing care.

## 2016-05-23 ENCOUNTER — Emergency Department (HOSPITAL_BASED_OUTPATIENT_CLINIC_OR_DEPARTMENT_OTHER)
Admission: EM | Admit: 2016-05-23 | Discharge: 2016-05-23 | Disposition: A | Discharge status: Home / Self Care | Payer: BLUE CROSS/BLUE SHIELD | Attending: Emergency Medicine | Admitting: Emergency Medicine

## 2016-05-23 ENCOUNTER — Encounter (HOSPITAL_BASED_OUTPATIENT_CLINIC_OR_DEPARTMENT_OTHER): Payer: Self-pay | Admitting: Emergency Medicine

## 2016-05-23 DIAGNOSIS — K219 Gastro-esophageal reflux disease without esophagitis: Secondary | ICD-10-CM | POA: Diagnosis present

## 2016-05-23 DIAGNOSIS — K2100 Gastro-esophageal reflux disease with esophagitis, without bleeding: Secondary | ICD-10-CM

## 2016-05-23 DIAGNOSIS — R000 Tachycardia, unspecified: Secondary | ICD-10-CM | POA: Diagnosis not present

## 2016-05-23 HISTORY — DX: Gastro-esophageal reflux disease without esophagitis: K21.9

## 2016-05-23 LAB — PREGNANCY, URINE: Preg Test, Ur: NEGATIVE

## 2016-05-23 MED ORDER — GI COCKTAIL ~~LOC~~
30.0000 mL | Freq: Once | ORAL | Status: AC
  Administered 2016-05-23: 30 mL via ORAL
  Filled 2016-05-23: qty 30

## 2016-05-23 MED ORDER — RANITIDINE HCL 150 MG PO TABS: 150.0000 mg | ORAL_TABLET | Freq: Two times a day (BID) | ORAL | Status: DC

## 2016-05-23 MED FILL — SM ACID REDUCER 150 MG TAB: 150 MG | 32 days supply | Qty: 65 | Fill #0

## 2016-05-23 NOTE — ED Notes (Signed)
MD at bedside. 

## 2016-05-23 NOTE — ED Notes (Signed)
Pt reports acid reflux and burning feeling in stomach. Has used pepto-bismal and gas x.

## 2016-05-23 NOTE — ED Provider Notes (Signed)
CSN: [id number]     Arrival date & time 05/23/16  4098 History   First MD Initiated Contact with Patient 05/23/16 [phone]     Chief Complaint  Patient presents with  . Gastroesophageal Reflux     (Consider location/radiation/quality/duration/timing/severity/associated sxs/prior Treatment) Patient is a 20 y.o. female presenting with GERD. The history is provided by the patient.  Gastroesophageal Reflux This is a recurrent problem. Episode onset: severe over the last week. The problem occurs constantly. The problem has not changed since onset.Associated symptoms include abdominal pain. Associated symptoms comments: occassional nausea and vomiting.  This morning vomit was yellow and had a bowel movement that was black this morning.  Pt denies any diarrhea or constipation.  No recent travel outside the [place].  No fever, vaginal discharge, dysuria.. Exacerbated by: lying down. Nothing relieves the symptoms. Treatments tried: gas x, Pepto-Bismol. The treatment provided no relief.    Past Medical History  Diagnosis Date  . Anemia   . GERD (gastroesophageal reflux disease)    History reviewed. No pertinent past surgical history. No family history on file. Social History  Substance Use Topics  . Smoking status: Never Smoker   . Smokeless tobacco: None  . Alcohol Use: No   OB History    No data available     Review of Systems  Gastrointestinal: Positive for abdominal pain.  All other systems reviewed and are negative.     Allergies  Ciprofloxacin  Home Medications   Prior to Admission medications   Medication Sig Start Date End Date Taking? Authorizing Provider  benzonatate (TESSALON) 100 MG capsule Take 1 capsule (100 mg total) by mouth every 8 (eight) hours. 08/29/15   Everlene Farrier, PA-C  cetirizine (ZYRTEC ALLERGY) 10 MG tablet Take 1 tablet (10 mg total) by mouth daily. 08/29/15   Everlene Farrier, PA-C  ferrous fumarate (HEMOCYTE - 106 MG FE) 325 (106 FE) MG TABS tablet Take 1  tablet by mouth.    Historical Provider, MD  fluticasone (FLONASE) 50 MCG/ACT nasal spray Place 2 sprays into both nostrils daily. 08/29/15   Everlene Farrier, PA-C  naproxen (NAPROSYN) 250 MG tablet Take 1 tablet (250 mg total) by mouth 2 (two) times daily with a meal. 08/29/15   Everlene Farrier, PA-C  PRESCRIPTION MEDICATION Birth Control pills    Historical Provider, MD  triamcinolone (NASACORT AQ) 55 MCG/ACT AERO nasal inhaler Place 1 spray into the nose 2 (two) times daily. 11/29/15 12/03/15  Gerhard Munch, MD   BP 138/85 mmHg  Pulse 120  Temp(Src) 98.7 F (37.1 C) (Oral)  Resp 18  Ht  (1.651 m)  Wt 138 lb (62.596 kg)  BMI 22.96 kg/m2  SpO2 100%  LMP 05/03/2016 Physical Exam  Constitutional: She is oriented to person, place, and time. She appears well-developed and well-nourished. No distress.  HENT:  Head: Normocephalic and atraumatic.  Mouth/Throat: Oropharynx is clear and moist.  Eyes: Conjunctivae and EOM are normal. Pupils are equal, round, and reactive to light.  Neck: Normal range of motion. Neck supple.  Cardiovascular: Regular rhythm and intact distal pulses.  Tachycardia present.   No murmur heard. Pulmonary/Chest: Effort normal and breath sounds normal. No respiratory distress. She has no wheezes. She has no rales.  Abdominal: Soft. She exhibits no distension. There is tenderness in the epigastric area. There is no rebound and no guarding.  Musculoskeletal: Normal range of motion. She exhibits no edema or tenderness.  Neurological: She is alert and oriented to person, place, and time.  Skin: Skin is warm and dry. No rash noted. No erythema.  Psychiatric: She has a normal mood and affect. Her behavior is normal.  Nursing note and vitals reviewed.   ED Course  Procedures (including critical care time) Labs Review Labs Reviewed  PREGNANCY, URINE    Imaging Review No results found. I have personally reviewed and evaluated these images and lab results as part  of my medical decision-making.   EKG Interpretation None      MDM   Final diagnoses:  Gastroesophageal reflux disease with esophagitis    Patient is a healthy 20 year old female presenting today with epigastric discomfort which is worsened over the last week most consistent with GERD. She has tried Pepto-Bismol and Gas-X without improvement. She takes nothing chronically for her reflux. She has had years of reflux but states it's worse now. Occasionally with vomiting and this morning had 1 black bowel movement.  On exam patient is well appearing. Initially tachycardic upon arrival but resolved spontaneously. Patient is having epigastric discomfort but no other abdominal pain. She has no vaginal or urinary symptoms. No tenderness concerning for cholecystitis, appendicitis, pancreatitis or diverticulitis. She denies any hematemesis. The only black bowel movement she had was after taking Pepto-Bismol. Patient was given a GI cocktail. We'll start on Zantac for the next 2 weeks and recommended diet changes and avoidance of all NSAIDs    Gwyneth SproutWhitney Denzell Colasanti, MD 05/23/16 1019

## 2016-05-24 ENCOUNTER — Emergency Department (HOSPITAL_BASED_OUTPATIENT_CLINIC_OR_DEPARTMENT_OTHER)
Admission: EM | Admit: 2016-05-24 | Discharge: 2016-05-24 | Disposition: A | Discharge status: Home / Self Care | Payer: BLUE CROSS/BLUE SHIELD | Attending: Emergency Medicine | Admitting: Emergency Medicine

## 2016-05-24 ENCOUNTER — Encounter (HOSPITAL_BASED_OUTPATIENT_CLINIC_OR_DEPARTMENT_OTHER): Payer: Self-pay | Admitting: *Deleted

## 2016-05-24 DIAGNOSIS — M545 Low back pain, unspecified: Secondary | ICD-10-CM

## 2016-05-24 LAB — WET PREP, GENITAL
SPERM: NONE SEEN
TRICH WET PREP: NONE SEEN
YEAST WET PREP: NONE SEEN

## 2016-05-24 LAB — URINALYSIS, ROUTINE W REFLEX MICROSCOPIC
BILIRUBIN URINE: NEGATIVE
Glucose, UA: NEGATIVE mg/dL
HGB URINE DIPSTICK: NEGATIVE
Ketones, ur: 15 mg/dL — AB
Nitrite: NEGATIVE
PH: 6.5 (ref 5.0–8.0)
Protein, ur: NEGATIVE mg/dL
SPECIFIC GRAVITY, URINE: 1.004 — AB (ref 1.005–1.030)

## 2016-05-24 LAB — URINE MICROSCOPIC-ADD ON: RBC / HPF: NONE SEEN RBC/hpf (ref 0–5)

## 2016-05-24 LAB — PREGNANCY, URINE: Preg Test, Ur: NEGATIVE

## 2016-05-24 MED ORDER — AZITHROMYCIN 250 MG PO TABS
1000.0000 mg | ORAL_TABLET | Freq: Once | ORAL | Status: AC
  Administered 2016-05-24: 1000 mg via ORAL
  Filled 2016-05-24: qty 4

## 2016-05-24 MED ORDER — CEFTRIAXONE SODIUM 250 MG IJ SOLR
250.0000 mg | Freq: Once | INTRAMUSCULAR | Status: AC
  Administered 2016-05-24: 250 mg via INTRAMUSCULAR
  Filled 2016-05-24: qty 250

## 2016-05-24 MED ORDER — KETOROLAC TROMETHAMINE 60 MG/2ML IM SOLN
60.0000 mg | Freq: Once | INTRAMUSCULAR | Status: AC
  Administered 2016-05-24: 60 mg via INTRAMUSCULAR
  Filled 2016-05-24: qty 2

## 2016-05-24 MED ORDER — NAPROXEN 500 MG PO TABS: 500.0000 mg | ORAL_TABLET | Freq: Two times a day (BID) | ORAL | Status: DC

## 2016-05-24 NOTE — ED Notes (Signed)
Back pain since this am. No injury.

## 2016-05-24 NOTE — ED Notes (Signed)
Pt says that she cannot stay, she has to leave to pick up her mother. Discussed with pt that we keep pt after IM injection of abx, pt says she cannot stay. Discussed with her if she suspects reaction to return. Pt ambulatory at d/c with steady gait.

## 2016-05-24 NOTE — Discharge Instructions (Signed)
You were treated today for possible pelvic infection which could be the cause of your back pain. Take naproxen as prescribed for back pain. Urine culture is pending and if it returns positive you will be notified. If symptoms are not improving or worsening, please follow-up with your doctor or return to emergency department.   Back Pain, Adult Back pain is very common in adults.The cause of back pain is rarely dangerous and the pain often gets better over time.The cause of your back pain may not be known. Some common causes of back pain include:  Strain of the muscles or ligaments supporting the spine.  Wear and tear (degeneration) of the spinal disks.  Arthritis.  Direct injury to the back. For many people, back pain may return. Since back pain is rarely dangerous, most people can learn to manage this condition on their own. HOME CARE INSTRUCTIONS Watch your back pain for any changes. The following actions may help to lessen any discomfort you are feeling:  Remain active. It is stressful on your back to sit or stand in one place for long periods of time. Do not sit, drive, or stand in one place for more than 30 minutes at a time. Take short walks on even surfaces as soon as you are able.Try to increase the length of time you walk each day.  Exercise regularly as directed by your health care provider. Exercise helps your back heal faster. It also helps avoid future injury by keeping your muscles strong and flexible.  Do not stay in bed.Resting more than 1-2 days can delay your recovery.  Pay attention to your body when you bend and lift. The most comfortable positions are those that put less stress on your recovering back. Always use proper lifting techniques, including:  Bending your knees.  Keeping the load close to your body.  Avoiding twisting.  Find a comfortable position to sleep. Use a firm mattress and lie on your side with your knees slightly bent. If you lie on your back,  put a pillow under your knees.  Avoid feeling anxious or stressed.Stress increases muscle tension and can worsen back pain.It is important to recognize when you are anxious or stressed and learn ways to manage it, such as with exercise.  Take medicines only as directed by your health care provider. Over-the-counter medicines to reduce pain and inflammation are often the most helpful.Your health care provider may prescribe muscle relaxant drugs.These medicines help dull your pain so you can more quickly return to your normal activities and healthy exercise.  Apply ice to the injured area:  Put ice in a plastic bag.  Place a towel between your skin and the bag.  Leave the ice on for 20 minutes, 2-3 times a day for the first 2-3 days. After that, ice and heat may be alternated to reduce pain and spasms.  Maintain a healthy weight. Excess weight puts extra stress on your back and makes it difficult to maintain good posture. SEEK MEDICAL CARE IF:  You have pain that is not relieved with rest or medicine.  You have increasing pain going down into the legs or buttocks.  You have pain that does not improve in one week.  You have night pain.  You lose weight.  You have a fever or chills. SEEK IMMEDIATE MEDICAL CARE IF:   You develop new bowel or bladder control problems.  You have unusual weakness or numbness in your arms or legs.  You develop nausea or vomiting.  You develop abdominal pain.  You feel faint.   This information is not intended to replace advice given to you by your health care provider. Make sure you discuss any questions you have with your health care provider.   Document Released: 11/25/2005 Document Revised: 12/16/2014 Document Reviewed: 03/29/2014 Elsevier Interactive Patient Education 2016 Elsevier Inc[employer]  Cervicitis Cervicitis is a soreness and swelling (inflammation) of the cervix. Your cervix is located at the bottom of your uterus. It opens up to the  vagina. CAUSES   Sexually transmitted infections (STIs).   Allergic reaction.   Medicines or birth control devices that are put in the vagina.   Injury to the cervix.   Bacterial infections.  RISK FACTORS You are at greater risk if you:  Have unprotected sexual intercourse.  Have sexual intercourse with many partners.  Began sexual intercourse at an early age.  Have a history of STIs. SYMPTOMS  There may be no symptoms. If symptoms occur, they may include:   Gray, white, yellow, or bad-smelling vaginal discharge.   Pain or itching of the area outside the vagina.   Painful sexual intercourse.   Lower abdominal or lower back pain, especially during intercourse.   Frequent urination.   Abnormal vaginal bleeding between periods, after sexual intercourse, or after menopause.   Pressure or a heavy feeling in the pelvis.  DIAGNOSIS  Diagnosis is made after a pelvic exam. Other tests may include:   Examination of any discharge under a microscope (wet prep).   A Pap test.  TREATMENT  Treatment will depend on the cause of cervicitis. If it is caused by an STI, both you and your partner will need to be treated. Antibiotic medicines will be given.  HOME CARE INSTRUCTIONS   Do not have sexual intercourse until your health care provider says it is okay.   Do not have sexual intercourse until your partner has been treated, if your cervicitis is caused by an STI.   Take your antibiotics as directed. Finish them even if you start to feel better.  SEEK MEDICAL CARE IF:  Your symptoms come back.   You have a fever.  MAKE SURE YOU:   Understand these instructions.  Will watch your condition.  Will get help right away if you are not doing well or get worse.   This information is not intended to replace advice given to you by your health care provider. Make sure you discuss any questions you have with your health care provider.   Document Released:  11/25/2005 Document Revised: 11/30/2013 Document Reviewed: 05/19/2013 Elsevier Interactive Patient Education [employer]2016 Elsevier Inc.

## 2016-05-24 NOTE — ED Provider Notes (Signed)
CSN: [id number]     Arrival date & time 05/24/16  1936 History  By signing my name below, I, Jasmyn B. Alexander, attest that this documentation has been prepared under the direction and in the presence of Donavon Kimrey, PA-C.  Electronically Signed: Gillis Ends. Lyn Hollingshead, ED Scribe. 05/24/2016. 9:27 PM.     Chief Complaint  Patient presents with  . Back Pain   The history is provided by the patient. No language interpreter was used.   HPI Comments: Diamond Williams is a 20 y.o. female with PMhx of Anemia and GERD who presents to the Emergency Department complaining of gradual onset, intermittent, lower back pain x 1 day. She states she woke up on 05/24/16 when she felt pain in lower back which she thought was from sleeping wrong. However, she states pain was not relieved and began to spread to both sides of the back. She notes that pain is exacerbated upon pressure but not when she moves or walks. Pain does not radiate to any other areas of the body. She has not taken any medications for pain but has applied IcyHot on lower back with no relief. Patient states she is sexually active but does not have any vaginal discharge, stomach pain, dysuria, frequency, or urgency. Her LNMP occurred at the end of May 2017. There are no other associated symptoms.  Past Medical History  Diagnosis Date  . Anemia   . GERD (gastroesophageal reflux disease)    History reviewed. No pertinent past surgical history. No family history on file. Social History  Substance Use Topics  . Smoking status: Never Smoker   . Smokeless tobacco: None  . Alcohol Use: No   OB History    No data available     Review of Systems  Genitourinary: Negative for dysuria, urgency, frequency and vaginal discharge.  Musculoskeletal: Positive for back pain.  All other systems reviewed and are negative.  Allergies  Ciprofloxacin  Home Medications   Prior to Admission medications   Medication Sig Start Date End Date Taking?  Authorizing Provider  benzonatate (TESSALON) 100 MG capsule Take 1 capsule (100 mg total) by mouth every 8 (eight) hours. 08/29/15   Everlene Farrier, PA-C  cetirizine (ZYRTEC ALLERGY) 10 MG tablet Take 1 tablet (10 mg total) by mouth daily. 08/29/15   Everlene Farrier, PA-C  ferrous fumarate (HEMOCYTE - 106 MG FE) 325 (106 FE) MG TABS tablet Take 1 tablet by mouth.    Historical Provider, MD  fluticasone (FLONASE) 50 MCG/ACT nasal spray Place 2 sprays into both nostrils daily. 08/29/15   Everlene Farrier, PA-C  naproxen (NAPROSYN) 250 MG tablet Take 1 tablet (250 mg total) by mouth 2 (two) times daily with a meal. 08/29/15   Everlene Farrier, PA-C  PRESCRIPTION MEDICATION Birth Control pills    Historical Provider, MD  ranitidine (ZANTAC) 150 MG tablet Take 1 tablet (150 mg total) by mouth 2 (two) times daily. 05/23/16   Gwyneth Sprout, MD  triamcinolone (NASACORT AQ) 55 MCG/ACT AERO nasal inhaler Place 1 spray into the nose 2 (two) times daily. 11/29/15 12/03/15  Gerhard Munch, MD   BP 128/82 mmHg  Pulse 88  Temp(Src) 99.6 F (37.6 C) (Oral)  Resp 18  Ht 5\' 5"  (1.651 m)  Wt 138 lb (62.596 kg)  BMI 22.96 kg/m2  SpO2 100%  LMP 05/03/2016 Physical Exam  Constitutional: She is oriented to person, place, and time. She appears well-developed and well-nourished.  HENT:  Head: Normocephalic and atraumatic.  Eyes: EOM are normal.  Pupils are equal, round, and reactive to light.  Neck: Normal range of motion.  Cardiovascular: Normal rate, regular rhythm and normal heart sounds.   Pulmonary/Chest: Effort normal and breath sounds normal. No respiratory distress. She has no wheezes. She has no rales.  Abdominal: Soft. Bowel sounds are normal. She exhibits no distension. There is no tenderness. There is no rebound and no guarding.  No CVA tenderenss  Genitourinary:  Normal external genitalia. Normal vaginal canal. Moderate yellowish- white discharge. Cervix is normal, closed. No CMT. No uterine or adnexal  tenderness. No masses palpated.    Musculoskeletal: Normal range of motion.  Neurological: She is alert and oriented to person, place, and time.  Skin: Skin is warm and dry. No rash noted.  Psychiatric: She has a normal mood and affect. Judgment normal.  Nursing note and vitals reviewed.   ED Course  Procedures (including critical care time) DIAGNOSTIC STUDIES: Oxygen Saturation is 100% on RA, normal by my interpretation.    COORDINATION OF CARE: 9:23 PM-Discussed treatment plan which includes Wet prep, Pregnancy test and UA with pt at bedside and pt agreed to plan. Will order Toradol.  Labs Review Labs Reviewed  URINALYSIS, ROUTINE W REFLEX MICROSCOPIC (NOT AT [facility]) - Abnormal; Notable for the following:    Specific Gravity, Urine 1.004 (*)    Ketones, ur 15 (*)    Leukocytes, UA TRACE (*)    All other components within normal limits  URINE MICROSCOPIC-ADD ON - Abnormal; Notable for the following:    Squamous Epithelial / LPF 0-5 (*)    Bacteria, UA FEW (*)    All other components within normal limits  PREGNANCY, URINE   I have personally reviewed and evaluated these lab results as part of my medical decision-making.   Exam performed by Jaynie Crumble, PA-C,  exam chaperoned  Date: 05/24/2016 Pelvic exam: normal external genitalia without evidence of trauma. VULVA: normal appearing vulva with no masses, tenderness or lesion. VAGINA: normal appearing vagina with normal color and discharge, no lesions. CERVIX: normal appearing cervix without lesions, cervical motion tenderness absent, cervical os closed with out purulent discharge; vaginal discharge - white, Wet prep and DNA probe for chlamydia and GC obtained.   ADNEXA: normal adnexa in size, nontender and no masses UTERUS: uterus is normal size, shape, consistency and nontender.   MDM   Final diagnoses:  Right-sided low back pain without sciatica   Pt with right lower back dull pain. Hx of kidney infection. Today,  no CVA tenderness. No abdominal tenderness. Doubt pyelonephritis. She is afebrile. Also pain does not worsen with movement. She states pain is dull, does not vary in severity. Doubt kidney stone. Cannot produce with palpation. Abdomen is completely benign. Pelvic exam did show moderate yellowish white discharge. However no cervical motion tenderness or adnexal tenderness. Question cervicitis. Immediately blood cells on wet prep. Treated with Rocephin 250 mg IM and Zithromax 1 g by mouth. Will start on NSAIDs. Will have patient follow-up with primary care doctor. At this time I do not think she needs any further imaging or workup.  Filed Vitals:   05/24/16 1939 05/24/16 2205  BP: 128/82 133/87  Pulse: 88 107  Temp: 99.6 F (37.6 C) 98.1 F (36.7 C)  TempSrc: Oral Oral  Resp: 18 14  Height:  (1.651 m)   Weight: 62.596 kg   SpO2: 100% 100%    I personally performed the services described in this documentation, which was scribed in my presence. The recorded information  has been reviewed and is accurate.   Jaynie Crumbleatyana Salahuddin Arismendez, PA-C 05/24/16 2243  Arby BarretteMarcy Pfeiffer, MD 05/25/16 1452

## 2016-05-27 LAB — URINE CULTURE

## 2016-05-27 LAB — GC/CHLAMYDIA PROBE AMP (~~LOC~~) NOT AT ARMC
CHLAMYDIA, DNA PROBE: POSITIVE — AB
Neisseria Gonorrhea: NEGATIVE

## 2016-05-28 ENCOUNTER — Telehealth (HOSPITAL_BASED_OUTPATIENT_CLINIC_OR_DEPARTMENT_OTHER): Payer: Self-pay | Admitting: Emergency Medicine

## 2016-05-28 ENCOUNTER — Emergency Department (HOSPITAL_BASED_OUTPATIENT_CLINIC_OR_DEPARTMENT_OTHER)
Admission: EM | Admit: 2016-05-28 | Discharge: 2016-05-28 | Disposition: A | Discharge status: Home / Self Care | Payer: BLUE CROSS/BLUE SHIELD | Attending: Emergency Medicine | Admitting: Emergency Medicine

## 2016-05-28 ENCOUNTER — Encounter (HOSPITAL_BASED_OUTPATIENT_CLINIC_OR_DEPARTMENT_OTHER): Payer: Self-pay | Admitting: Emergency Medicine

## 2016-05-28 DIAGNOSIS — R102 Pelvic and perineal pain: Secondary | ICD-10-CM | POA: Diagnosis present

## 2016-05-28 DIAGNOSIS — N3289 Other specified disorders of bladder: Secondary | ICD-10-CM | POA: Diagnosis not present

## 2016-05-28 LAB — URINALYSIS, ROUTINE W REFLEX MICROSCOPIC
Bilirubin Urine: NEGATIVE
GLUCOSE, UA: NEGATIVE mg/dL
Hgb urine dipstick: NEGATIVE
Ketones, ur: 15 mg/dL — AB
NITRITE: NEGATIVE
PH: 7 (ref 5.0–8.0)
Protein, ur: NEGATIVE mg/dL
SPECIFIC GRAVITY, URINE: 1.01 (ref 1.005–1.030)

## 2016-05-28 LAB — URINE MICROSCOPIC-ADD ON

## 2016-05-28 LAB — PREGNANCY, URINE: Preg Test, Ur: NEGATIVE

## 2016-05-28 MED ORDER — PHENAZOPYRIDINE HCL 200 MG PO TABS: 200.0000 mg | ORAL_TABLET | Freq: Three times a day (TID) | ORAL | Status: DC

## 2016-05-28 MED ORDER — IBUPROFEN 800 MG PO TABS: 800.0000 mg | ORAL_TABLET | Freq: Three times a day (TID) | ORAL | Status: DC | PRN

## 2016-05-28 MED ORDER — NITROFURANTOIN MONOHYD MACRO 100 MG PO CAPS: 100.0000 mg | ORAL_CAPSULE | Freq: Two times a day (BID) | ORAL | Status: DC

## 2016-05-28 NOTE — ED Notes (Signed)
Pt. Had a pelvic done on Friday and has been treated for STD with Rocephin and Zyth.

## 2016-05-28 NOTE — ED Notes (Signed)
Patient states that she was here on Friday and treated for Clymidia, the patient reports that she feels like she is having some bladder pain and fullness now.

## 2016-05-28 NOTE — Discharge Instructions (Signed)
Return here as needed.  Follow-up with your primary care doctor °

## 2016-05-29 ENCOUNTER — Emergency Department (HOSPITAL_BASED_OUTPATIENT_CLINIC_OR_DEPARTMENT_OTHER): Payer: BLUE CROSS/BLUE SHIELD

## 2016-05-29 ENCOUNTER — Encounter (HOSPITAL_BASED_OUTPATIENT_CLINIC_OR_DEPARTMENT_OTHER): Payer: Self-pay

## 2016-05-29 ENCOUNTER — Emergency Department (HOSPITAL_BASED_OUTPATIENT_CLINIC_OR_DEPARTMENT_OTHER)
Admission: EM | Admit: 2016-05-29 | Discharge: 2016-05-29 | Disposition: A | Discharge status: Home / Self Care | Payer: BLUE CROSS/BLUE SHIELD | Attending: Emergency Medicine | Admitting: Emergency Medicine

## 2016-05-29 DIAGNOSIS — R1032 Left lower quadrant pain: Secondary | ICD-10-CM | POA: Diagnosis present

## 2016-05-29 DIAGNOSIS — N83202 Unspecified ovarian cyst, left side: Secondary | ICD-10-CM | POA: Diagnosis not present

## 2016-05-29 DIAGNOSIS — N390 Urinary tract infection, site not specified: Secondary | ICD-10-CM | POA: Diagnosis not present

## 2016-05-29 LAB — URINE MICROSCOPIC-ADD ON

## 2016-05-29 LAB — PREGNANCY, URINE: Preg Test, Ur: NEGATIVE

## 2016-05-29 LAB — URINALYSIS, ROUTINE W REFLEX MICROSCOPIC
GLUCOSE, UA: NEGATIVE mg/dL
Hgb urine dipstick: NEGATIVE
NITRITE: POSITIVE — AB
PH: 5.5 (ref 5.0–8.0)
Protein, ur: NEGATIVE mg/dL
SPECIFIC GRAVITY, URINE: 1.022 (ref 1.005–1.030)

## 2016-05-29 MED ORDER — OXYCODONE-ACETAMINOPHEN 5-325 MG PO TABS
1.0000 | ORAL_TABLET | Freq: Once | ORAL | Status: AC
  Administered 2016-05-29: 1 via ORAL
  Filled 2016-05-29: qty 1

## 2016-05-29 MED ORDER — OXYCODONE-ACETAMINOPHEN 5-325 MG PO TABS: 1.0000 | ORAL_TABLET | ORAL | Status: DC | PRN

## 2016-05-29 MED ORDER — CEPHALEXIN 500 MG PO CAPS
500.0000 mg | ORAL_CAPSULE | Freq: Two times a day (BID) | ORAL | Status: DC
Start: 2016-05-29 — End: 2016-06-28

## 2016-05-29 NOTE — ED Provider Notes (Signed)
CSN: [id number]     Arrival date & time 05/29/16  1518 History   First MD Initiated Contact with Patient 05/29/16 1603     Chief Complaint  Patient presents with  . LLQ pain      (Consider location/radiation/quality/duration/timing/severity/associated sxs/prior Treatment) The history is provided by the patient and medical records.    20 y.o. F with hx of anemia, GERD, PCOS, presenting to the ED for left lower abdominal pain.  Patient reports she was seen here 5 days ago for back pain, had a pelvic done and was treated for STD's.  States she was seen here yesterday as well for bladder spasms and sensation of fullness prior to urination.  States she was started on antibiotics and has been taking them as directed.  States today she developed some left lower abdominal pain, more towards her pelvic region.  She states it feels like very intense menstrual cramps but when she presses on the area is very tender to palpation and feels "sore".  She denies any recent sexual activity.  She denies any vaginal discharge or bladder.  No fever, chills, sweats.  No flank pain.  States she did have some vomiting this morning but she thinks this is due to the antibiotics she is taking.  She denies any current nausea.  Does have hx of PCOS, this is followed by her GYN.    Past Medical History  Diagnosis Date  . Anemia   . GERD (gastroesophageal reflux disease)    History reviewed. No pertinent past surgical history. No family history on file. Social History  Substance Use Topics  . Smoking status: Never Smoker   . Smokeless tobacco: None  . Alcohol Use: No   OB History    No data available     Review of Systems  Gastrointestinal: Positive for abdominal pain.  All other systems reviewed and are negative.     Allergies  Ciprofloxacin  Home Medications   Prior to Admission medications   Medication Sig Start Date End Date Taking? Authorizing Provider  benzonatate (TESSALON) 100 MG capsule Take 1  capsule (100 mg total) by mouth every 8 (eight) hours. 08/29/15   Everlene Farrier, PA-C  cetirizine (ZYRTEC ALLERGY) 10 MG tablet Take 1 tablet (10 mg total) by mouth daily. 08/29/15   Everlene Farrier, PA-C  ferrous fumarate (HEMOCYTE - 106 MG FE) 325 (106 FE) MG TABS tablet Take 1 tablet by mouth.    Historical Provider, MD  fluticasone (FLONASE) 50 MCG/ACT nasal spray Place 2 sprays into both nostrils daily. 08/29/15   Everlene Farrier, PA-C  ibuprofen (ADVIL,MOTRIN) 800 MG tablet Take 1 tablet (800 mg total) by mouth every 8 (eight) hours as needed. 05/28/16   Charlestine Night, PA-C  naproxen (NAPROSYN) 250 MG tablet Take 1 tablet (250 mg total) by mouth 2 (two) times daily with a meal. 08/29/15   Everlene Farrier, PA-C  naproxen (NAPROSYN) 500 MG tablet Take 1 tablet (500 mg total) by mouth 2 (two) times daily. 05/24/16   Tatyana Kirichenko, PA-C  nitrofurantoin, macrocrystal-monohydrate, (MACROBID) 100 MG capsule Take 1 capsule (100 mg total) by mouth 2 (two) times daily. 05/28/16   Charlestine Night, PA-C  phenazopyridine (PYRIDIUM) 200 MG tablet Take 1 tablet (200 mg total) by mouth 3 (three) times daily. 05/28/16   Charlestine Night, PA-C  PRESCRIPTION MEDICATION Birth Control pills    Historical Provider, MD  ranitidine (ZANTAC) 150 MG tablet Take 1 tablet (150 mg total) by mouth 2 (two) times daily. 05/23/16  Gwyneth Sprout, MD  triamcinolone (NASACORT AQ) 55 MCG/ACT AERO nasal inhaler Place 1 spray into the nose 2 (two) times daily. 11/29/15 12/03/15  Gerhard Munch, MD   BP 124/91 mmHg  Pulse 93  Temp(Src) 99.3 F (37.4 C) (Oral)  Resp 18  SpO2 99%  LMP 05/03/2016   Physical Exam  Constitutional: She is oriented to person, place, and time. She appears well-developed and well-nourished. No distress.  HENT:  Head: Normocephalic and atraumatic.  Mouth/Throat: Oropharynx is clear and moist.  Eyes: Conjunctivae and EOM are normal. Pupils are equal, round, and reactive to light.  Neck:  Normal range of motion. Neck supple.  Cardiovascular: Normal rate, regular rhythm and normal heart sounds.   Pulmonary/Chest: Effort normal and breath sounds normal. No respiratory distress. She has no wheezes.  Abdominal: Soft. Bowel sounds are normal. There is tenderness in the left lower quadrant. There is no rigidity, no guarding and no CVA tenderness.    TTP in LLQ/pelvic region; no rebound or guarding, no peritonitis; no CVA tenderness  Musculoskeletal: Normal range of motion.  Neurological: She is alert and oriented to person, place, and time.  Skin: Skin is warm. She is not diaphoretic.  Psychiatric: She has a normal mood and affect.  Nursing note and vitals reviewed.   ED Course  Procedures (including critical care time) Labs Review Labs Reviewed - No data to display  Imaging Review US Transvaginal Non-ob  05/29/2016  CLINICAL DATA:  Left adnexal pain for 3 days EXAM: TRANSABDOMINAL AND TRANSVAGINAL ULTRASOUND OF PELVIS DOPPLER ULTRASOUND OF OVARIES TECHNIQUE: Both transabdominal and transvaginal ultrasound examinations of the pelvis were performed. Transabdominal technique was performed for global imaging of the pelvis including uterus, ovaries, adnexal regions, and pelvic cul-de-sac. It was necessary to proceed with endovaginal exam following the transabdominal exam to visualize the uterus, endometrium, ovaries and adnexa . Color and duplex Doppler ultrasound was utilized to evaluate blood flow to the ovaries. COMPARISON:  CT 07/04/2015 FINDINGS: Uterus Measurements: 8.9 x 4.4 x 5.5 cm. No fibroids or other mass visualized. Endometrium Thickness: 10 mm in thickness.  No focal abnormality visualized. Right ovary Measurements: 2.0 x 1.3 x 2.5 cm. Normal appearance/no adnexal mass. Left ovary Measurements: 7.0 x 4.5 x 5.9 cm. 6.4 cm simple appearing cyst. No septations or nodularity Pulsed Doppler evaluation of both ovaries demonstrates normal low-resistance arterial and venous  waveforms. Other findings Small amount of free fluid in the left adnexa. IMPRESSION: 6.4 cm cyst within the left ovary which appears simple. No evidence of torsion. This could be followed with repeat ultrasound in 3-4 months to ensure resolution. Electronically Signed   By: Charlett Nose M.D.   On: 05/29/2016 18:09   US Pelvis Complete  05/29/2016  CLINICAL DATA:  Left adnexal pain for 3 days EXAM: TRANSABDOMINAL AND TRANSVAGINAL ULTRASOUND OF PELVIS DOPPLER ULTRASOUND OF OVARIES TECHNIQUE: Both transabdominal and transvaginal ultrasound examinations of the pelvis were performed. Transabdominal technique was performed for global imaging of the pelvis including uterus, ovaries, adnexal regions, and pelvic cul-de-sac. It was necessary to proceed with endovaginal exam following the transabdominal exam to visualize the uterus, endometrium, ovaries and adnexa . Color and duplex Doppler ultrasound was utilized to evaluate blood flow to the ovaries. COMPARISON:  CT 07/04/2015 FINDINGS: Uterus Measurements: 8.9 x 4.4 x 5.5 cm. No fibroids or other mass visualized. Endometrium Thickness: 10 mm in thickness.  No focal abnormality visualized. Right ovary Measurements: 2.0 x 1.3 x 2.5 cm. Normal appearance/no adnexal mass. Left ovary Measurements:  7.0 x 4.5 x 5.9 cm. 6.4 cm simple appearing cyst. No septations or nodularity Pulsed Doppler evaluation of both ovaries demonstrates normal low-resistance arterial and venous waveforms. Other findings Small amount of free fluid in the left adnexa. IMPRESSION: 6.4 cm cyst within the left ovary which appears simple. No evidence of torsion. This could be followed with repeat ultrasound in 3-4 months to ensure resolution. Electronically Signed   By: Charlett NoseKevin  Dover M.D.   On: 05/29/2016 18:09   [place] Art/ven Flow Abd Pelv Doppler  05/29/2016  CLINICAL DATA:  Left adnexal pain for 3 days EXAM: TRANSABDOMINAL AND TRANSVAGINAL ULTRASOUND OF PELVIS DOPPLER ULTRASOUND OF OVARIES TECHNIQUE: Both  transabdominal and transvaginal ultrasound examinations of the pelvis were performed. Transabdominal technique was performed for global imaging of the pelvis including uterus, ovaries, adnexal regions, and pelvic cul-de-sac. It was necessary to proceed with endovaginal exam following the transabdominal exam to visualize the uterus, endometrium, ovaries and adnexa . Color and duplex Doppler ultrasound was utilized to evaluate blood flow to the ovaries. COMPARISON:  CT 07/04/2015 FINDINGS: Uterus Measurements: 8.9 x 4.4 x 5.5 cm. No fibroids or other mass visualized. Endometrium Thickness: 10 mm in thickness.  No focal abnormality visualized. Right ovary Measurements: 2.0 x 1.3 x 2.5 cm. Normal appearance/no adnexal mass. Left ovary Measurements: 7.0 x 4.5 x 5.9 cm. 6.4 cm simple appearing cyst. No septations or nodularity Pulsed Doppler evaluation of both ovaries demonstrates normal low-resistance arterial and venous waveforms. Other findings Small amount of free fluid in the left adnexa. IMPRESSION: 6.4 cm cyst within the left ovary which appears simple. No evidence of torsion. This could be followed with repeat ultrasound in 3-4 months to ensure resolution. Electronically Signed   By: Charlett NoseKevin  Dover M.D.   On: 05/29/2016 18:09   I have personally reviewed and evaluated these images and lab results as part of my medical decision-making.   EKG Interpretation None      MDM   Final diagnoses:  Abdominal pain, left lower quadrant  Left ovarian cyst  UTI (lower urinary tract infection)   20 year old female here with left lower abdominal/pelvic pain. Seen in the ED 5 days ago and had pelvic exam, was treated prophylactically for STDs. Her Chlamydia test was positive. She was seen again yesterday and diagnosed with a UTI start on Macrobid. States today she has worsening pain in her left lower quadrant.  Patient is afebrile, nontoxic. She denies any complaint of vaginal discharge. She does have some mild  tenderness in her left lower abdomen/pelvic region. UA remains positive for UTI. Urine pregnancy is negative. Patient does have history of PCOS therefore pelvic ultrasound was obtained which does reveal a simple left ovarian cyst. This is likely the source of her pain.  No evidence or torsion, TOA, or pelvic free fluid.  Her pain has improved in the ED with Percocet. Will discharge home with the same. [facility] will change her antibiotics to Keflex. She is to follow-up with her OB/GYN.  Discussed plan with patient, he/she acknowledged understanding and agreed with plan of care.  Return precautions given for new or worsening symptoms.  Garlon HatchetLisa M Islam Eichinger, PA-C 05/29/16 1931  Benjiman CoreNathan Pickering, MD 05/29/16 2141

## 2016-05-29 NOTE — Discharge Instructions (Signed)
Stop taking the macrobid/nitrofurantoin.  Start the keflex. Take percocet when needed for pain.  Use caution, can make you sleepy/drowsy. Follow-up with your OB-GYN regarding the ovarian cyst. Return here for new concerns.

## 2016-05-29 NOTE — ED Notes (Signed)
Pt reports she was tx for chlamydia 6/16, dx and started treatment for UTI yesterday. Pt now c/o LLQ pain which she states started over a week ago. Pt c/o nausea with vomiting today. Pt denies vaginal bleeding/discharge.

## 2016-05-29 NOTE — ED Notes (Signed)
Patient complains of LLQ pain that started yesterday after being evaluated in ED and treated for UTI. Reports vomiting with same and thinks related to antibiotics

## 2016-06-02 NOTE — ED Provider Notes (Signed)
CSN: [id number]     Arrival date & time 05/28/16  1548 History   First MD Initiated Contact with Patient 05/28/16 1732     Chief Complaint  Patient presents with  . Pelvic Pain     (Consider location/radiation/quality/duration/timing/severity/associated sxs/prior Treatment) HPI Patient presents to the emergency department with concerns about dysuria.  The patient states she still having urinary frequency and pain.  She states that she was treated with Rocephin and Zithromax for STDs on her previous visit.  She states that she did test positive for Chlamydia.  The patient is mainly concerned about the fact that she still having urinary symptoms. The patient denies chest pain, shortness of breath, headache,blurred vision, neck pain, fever, cough, weakness, numbness, dizziness, anorexia, edema, abdominal pain, nausea, vomiting, diarrhea, rash, back pain, dysuria, hematemesis, bloody stool, near syncope, or syncope.  Patient denies any vaginal bleeding Past Medical History  Diagnosis Date  . Anemia   . GERD (gastroesophageal reflux disease)    History reviewed. No pertinent past surgical history. History reviewed. No pertinent family history. Social History  Substance Use Topics  . Smoking status: Never Smoker   . Smokeless tobacco: None  . Alcohol Use: No   OB History    No data available     Review of Systems All other systems negative except as documented in the HPI. All pertinent positives and negatives as reviewed in the HPI.   Allergies  Ciprofloxacin  Home Medications   Prior to Admission medications   Medication Sig Start Date End Date Taking? Authorizing Provider  benzonatate (TESSALON) 100 MG capsule Take 1 capsule (100 mg total) by mouth every 8 (eight) hours. 08/29/15   Everlene FarrierWilliam Dansie, PA-C  cephALEXin (KEFLEX) 500 MG capsule Take 1 capsule (500 mg total) by mouth 2 (two) times daily. 05/29/16   Garlon HatchetLisa M Sanders, PA-C  cetirizine (ZYRTEC ALLERGY) 10 MG tablet Take 1  tablet (10 mg total) by mouth daily. 08/29/15   Everlene FarrierWilliam Dansie, PA-C  ferrous fumarate (HEMOCYTE - 106 MG FE) 325 (106 FE) MG TABS tablet Take 1 tablet by mouth.    Historical Provider, MD  fluticasone (FLONASE) 50 MCG/ACT nasal spray Place 2 sprays into both nostrils daily. 08/29/15   Everlene FarrierWilliam Dansie, PA-C  ibuprofen (ADVIL,MOTRIN) 800 MG tablet Take 1 tablet (800 mg total) by mouth every 8 (eight) hours as needed. 05/28/16   Charlestine Nighthristopher Zaelyn Barbary, PA-C  naproxen (NAPROSYN) 250 MG tablet Take 1 tablet (250 mg total) by mouth 2 (two) times daily with a meal. 08/29/15   Everlene FarrierWilliam Dansie, PA-C  naproxen (NAPROSYN) 500 MG tablet Take 1 tablet (500 mg total) by mouth 2 (two) times daily. 05/24/16   Tatyana Kirichenko, PA-C  nitrofurantoin, macrocrystal-monohydrate, (MACROBID) 100 MG capsule Take 1 capsule (100 mg total) by mouth 2 (two) times daily. 05/28/16   Charlestine Nighthristopher Jennipher Weatherholtz, PA-C  oxyCODONE-acetaminophen (PERCOCET/ROXICET) 5-325 MG tablet Take 1 tablet by mouth every 4 (four) hours as needed. 05/29/16   Garlon HatchetLisa M Sanders, PA-C  phenazopyridine (PYRIDIUM) 200 MG tablet Take 1 tablet (200 mg total) by mouth 3 (three) times daily. 05/28/16   Charlestine Nighthristopher Nyzir Dubois, PA-C  PRESCRIPTION MEDICATION Birth Control pills    Historical Provider, MD  ranitidine (ZANTAC) 150 MG tablet Take 1 tablet (150 mg total) by mouth 2 (two) times daily. 05/23/16   Gwyneth SproutWhitney Plunkett, MD  triamcinolone (NASACORT AQ) 55 MCG/ACT AERO nasal inhaler Place 1 spray into the nose 2 (two) times daily. 11/29/15 12/03/15  Gerhard Munchobert Lockwood, MD   BP 125/90  mmHg  Pulse 77  Temp(Src) 99.1 F (37.3 C) (Oral)  Resp 18  Ht 5\' 5"  (1.651 m)  Wt 63.504 kg  BMI 23.30 kg/m2  SpO2 99%  LMP 05/03/2016 Physical Exam  Constitutional: She is oriented to person, place, and time. She appears well-developed and well-nourished. No distress.  HENT:  Head: Normocephalic and atraumatic.  Mouth/Throat: Oropharynx is clear and moist.  Eyes: Pupils are equal, round, and  reactive to light.  Neck: Normal range of motion. Neck supple.  Cardiovascular: Normal rate, regular rhythm and normal heart sounds.  Exam reveals no gallop and no friction rub.   No murmur heard. Pulmonary/Chest: Effort normal and breath sounds normal. No respiratory distress. She has no wheezes.  Abdominal: Soft. Bowel sounds are normal. She exhibits no distension. There is no tenderness.  Neurological: She is alert and oriented to person, place, and time. She exhibits normal muscle tone. Coordination normal.  Skin: Skin is warm and dry. No rash noted. No erythema.  Psychiatric: She has a normal mood and affect. Her behavior is normal.  Nursing note and vitals reviewed.   ED Course  Procedures (including critical care time) Labs Review Labs Reviewed  URINALYSIS, ROUTINE W REFLEX MICROSCOPIC (NOT [facility]) - Abnormal; Notable for the following:    Ketones, ur 15 (*)    Leukocytes, UA TRACE (*)    All other components within normal limits  URINE MICROSCOPIC-ADD ON - Abnormal; Notable for the following:    Squamous Epithelial / LPF 0-5 (*)    Bacteria, UA FEW (*)    All other components within normal limits  PREGNANCY, URINE    Imaging Review No results found. I have personally reviewed and evaluated these images and lab results as part of my medical decision-making.  Patient be treated for possible UTI, based on her symptoms.  The urinalysis is not overwhelming, but based on her current symptomatology, we will treat for this.  The patient is advised return here as needed.  Patient agrees the plan and all questions were answered  Charlestine NightChristopher Thurza Kwiecinski, PA-C 06/02/16 [id number]  Arby BarretteMarcy Pfeiffer, MD 06/08/16 1023

## 2016-06-28 ENCOUNTER — Emergency Department (HOSPITAL_BASED_OUTPATIENT_CLINIC_OR_DEPARTMENT_OTHER)
Admission: EM | Admit: 2016-06-28 | Discharge: 2016-06-28 | Disposition: A | Discharge status: Home / Self Care | Payer: BLUE CROSS/BLUE SHIELD | Attending: Emergency Medicine | Admitting: Emergency Medicine

## 2016-06-28 ENCOUNTER — Emergency Department (HOSPITAL_BASED_OUTPATIENT_CLINIC_OR_DEPARTMENT_OTHER): Payer: BLUE CROSS/BLUE SHIELD

## 2016-06-28 ENCOUNTER — Encounter (HOSPITAL_BASED_OUTPATIENT_CLINIC_OR_DEPARTMENT_OTHER): Payer: Self-pay | Admitting: *Deleted

## 2016-06-28 DIAGNOSIS — J069 Acute upper respiratory infection, unspecified: Secondary | ICD-10-CM | POA: Insufficient documentation

## 2016-06-28 DIAGNOSIS — R05 Cough: Secondary | ICD-10-CM | POA: Diagnosis present

## 2016-06-28 LAB — URINALYSIS, ROUTINE W REFLEX MICROSCOPIC
Bilirubin Urine: NEGATIVE
GLUCOSE, UA: NEGATIVE mg/dL
Ketones, ur: 15 mg/dL — AB
Nitrite: NEGATIVE
PH: 7.5 (ref 5.0–8.0)
Protein, ur: NEGATIVE mg/dL
SPECIFIC GRAVITY, URINE: 1.026 (ref 1.005–1.030)

## 2016-06-28 LAB — URINE MICROSCOPIC-ADD ON

## 2016-06-28 LAB — PREGNANCY, URINE: Preg Test, Ur: NEGATIVE

## 2016-06-28 MED ORDER — BENZONATATE 100 MG PO CAPS: 100.0000 mg | ORAL_CAPSULE | Freq: Three times a day (TID) | ORAL | Status: DC

## 2016-06-28 NOTE — ED Notes (Signed)
MD at bedside. 

## 2016-06-28 NOTE — ED Notes (Signed)
Pt with cough and nasal congestion x 1 week cold sx improving began having  right rib pain x 2 days denies SOB or fevers

## 2016-06-28 NOTE — Discharge Instructions (Signed)
Upper Respiratory Infection, Adult Most upper respiratory infections (URIs) are a viral infection of the air passages leading to the lungs. A URI affects the nose, throat, and upper air passages. The most common type of URI is nasopharyngitis and is typically referred to as "the common cold." URIs run their course and usually go away on their own. Most of the time, a URI does not require medical attention, but sometimes a bacterial infection in the upper airways can follow a viral infection. This is called a secondary infection. Sinus and middle ear infections are common types of secondary upper respiratory infections. Bacterial pneumonia can also complicate a URI. A URI can worsen asthma and chronic obstructive pulmonary disease (COPD). Sometimes, these complications can require emergency medical care and may be life threatening.  CAUSES Almost all URIs are caused by viruses. A virus is a type of germ and can spread from one person to another.  RISKS FACTORS You may be at risk for a URI if:   You smoke.   You have chronic heart or lung disease.  You have a weakened defense (immune) system.   You are very young or very old.   You have nasal allergies or asthma.  You work in crowded or poorly ventilated areas.  You work in health care facilities or schools. SIGNS AND SYMPTOMS  Symptoms typically develop 2-3 days after you come in contact with a cold virus. Most viral URIs last 7-10 days. However, viral URIs from the influenza virus (flu virus) can last 14-18 days and are typically more severe. Symptoms may include:   Runny or stuffy (congested) nose.   Sneezing.   Cough.   Sore throat.   Headache.   Fatigue.   Fever.   Loss of appetite.   Pain in your forehead, behind your eyes, and over your cheekbones (sinus pain).  Muscle aches.  DIAGNOSIS  Your health care provider may diagnose a URI by:  Physical exam.  Tests to check that your symptoms are not due to  another condition such as:  Strep throat.  Sinusitis.  Pneumonia.  Asthma. TREATMENT  A URI goes away on its own with time. It cannot be cured with medicines, but medicines may be prescribed or recommended to relieve symptoms. Medicines may help:  Reduce your fever.  Reduce your cough.  Relieve nasal congestion. HOME CARE INSTRUCTIONS   Take medicines only as directed by your health care provider.   Gargle warm saltwater or take cough drops to comfort your throat as directed by your health care provider.  Use a warm mist humidifier or inhale steam from a shower to increase air moisture. This may make it easier to breathe.  Drink enough fluid to keep your urine clear or pale yellow.   Eat soups and other clear broths and maintain good nutrition.   Rest as needed.   Return to work when your temperature has returned to normal or as your health care provider advises. You may need to stay home longer to avoid infecting others. You can also use a face mask and careful hand washing to prevent spread of the virus.  Increase the usage of your inhaler if you have asthma.   Do not use any tobacco products, including cigarettes, chewing tobacco, or electronic cigarettes. If you need help quitting, ask your health care provider. PREVENTION  The best way to protect yourself from getting a cold is to practice good hygiene.   Avoid oral or hand contact with people with cold   symptoms.   Wash your hands often if contact occurs.  There is no clear evidence that vitamin C, vitamin E, echinacea, or exercise reduces the chance of developing a cold. However, it is always recommended to get plenty of rest, exercise, and practice good nutrition.  SEEK MEDICAL CARE IF:   You are getting worse rather than better.   Your symptoms are not controlled by medicine.   You have chills.  You have worsening shortness of breath.  You have brown or red mucus.  You have yellow or brown nasal  discharge.  You have pain in your face, especially when you bend forward.  You have a fever.  You have swollen neck glands.  You have pain while swallowing.  You have white areas in the back of your throat. SEEK IMMEDIATE MEDICAL CARE IF:   You have severe or persistent:  Headache.  Ear pain.  Sinus pain.  Chest pain.  You have chronic lung disease and any of the following:  Wheezing.  Prolonged cough.  Coughing up blood.  A change in your usual mucus.  You have a stiff neck.  You have changes in your:  Vision.  Hearing.  Thinking.  Mood. MAKE SURE YOU:   Understand these instructions.  Will watch your condition.  Will get help right away if you are not doing well or get worse.   This information is not intended to replace advice given to you by your health care provider. Make sure you discuss any questions you have with your health care provider.   Document Released: 05/21/2001 Document Revised: 04/11/2015 Document Reviewed: 03/02/2014 [employer] Interactive Patient Education 2016 [employer]  

## 2016-06-28 NOTE — ED Provider Notes (Signed)
CSN: [id number]     Arrival date & time 06/28/16  1745 History  By signing my name below, I, Diamond Williams, attest that this documentation has been prepared under the direction and in the presence of Rolan Bucco, MD . Electronically Signed: Freida Williams, Scribe. 06/28/2016. 7:13 PM.  Chief Complaint  Patient presents with  . Cough  . Chest Pain   The history is provided by the patient. No language interpreter was used.   HPI Comments:  Diamond Williams is a 20 y.o. female who presents to the Emergency Department complaining of persistent nasal congestion x 1 week with associated dry cough and subjective fever. She denies SOB, BLE pain, dysuria, nausea and vomiting. She has taken Mucinex and Tylenol cough syrup wth moderate relief. Pt also notes occasional right sided rib pain Which is worse with palpation. It's not worse with cough or breathing. She denies any shortness of breath. No leg pain or swelling.  Past Medical History  Diagnosis Date  . Anemia   . GERD (gastroesophageal reflux disease)    History reviewed. No pertinent past surgical history. History reviewed. No pertinent family history. Social History  Substance Use Topics  . Smoking status: Never Smoker   . Smokeless tobacco: Never Used  . Alcohol Use: No   OB History    No data available     Review of Systems  Constitutional: Negative for fever, chills, diaphoresis and fatigue.  HENT: Positive for congestion. Negative for rhinorrhea and sneezing.   Eyes: Negative.   Respiratory: Positive for cough. Negative for chest tightness and shortness of breath.   Cardiovascular: Positive for chest pain. Negative for leg swelling.  Gastrointestinal: Negative for nausea, vomiting, abdominal pain, diarrhea and blood in stool.  Genitourinary: Negative for frequency, hematuria, flank pain and difficulty urinating.  Musculoskeletal: Negative for back pain and arthralgias.       + Rib pain  Skin: Negative for rash.  Neurological:  Negative for dizziness, speech difficulty, weakness, numbness and headaches.   Allergies  Ciprofloxacin  Home Medications   Prior to Admission medications   Medication Sig Start Date End Date Taking? Authorizing Provider  benzonatate (TESSALON) 100 MG capsule Take 1 capsule (100 mg total) by mouth every 8 (eight) hours. 06/28/16   Rolan Bucco, MD  ferrous fumarate (HEMOCYTE - 106 MG FE) 325 (106 FE) MG TABS tablet Take 1 tablet by mouth.    Historical Provider, MD  fluticasone (FLONASE) 50 MCG/ACT nasal spray Place 2 sprays into both nostrils daily. 08/29/15   Everlene Farrier, PA-C  ibuprofen (ADVIL,MOTRIN) 800 MG tablet Take 1 tablet (800 mg total) by mouth every 8 (eight) hours as needed. 05/28/16   Charlestine Night, PA-C  naproxen (NAPROSYN) 500 MG tablet Take 1 tablet (500 mg total) by mouth 2 (two) times daily. 05/24/16   Tatyana Kirichenko, PA-C  phenazopyridine (PYRIDIUM) 200 MG tablet Take 1 tablet (200 mg total) by mouth 3 (three) times daily. 05/28/16   Charlestine Night, PA-C  PRESCRIPTION MEDICATION Birth Control pills    Historical Provider, MD  ranitidine (ZANTAC) 150 MG tablet Take 1 tablet (150 mg total) by mouth 2 (two) times daily. 05/23/16   Gwyneth Sprout, MD  triamcinolone (NASACORT AQ) 55 MCG/ACT AERO nasal inhaler Place 1 spray into the nose 2 (two) times daily. 11/29/15 12/03/15  Gerhard Munch, MD   BP 125/91 mmHg  Pulse 76  Temp(Src) 99.6 F (37.6 C) (Oral)  Resp 16  Ht  (1.651 m)  Wt 135 lb (61.236  kg)  BMI 22.47 kg/m2  SpO2 100%  LMP 05/27/2016 (Exact Date) Physical Exam  Constitutional: She is oriented to person, place, and time. She appears well-developed and well-nourished. No distress.  HENT:  Head: Normocephalic and atraumatic.  Right Ear: Tympanic membrane normal.  Left Ear: Tympanic membrane normal.  Mouth/Throat: Uvula is midline, oropharynx is clear and moist and mucous membranes are normal.  Eyes: EOM are normal. Pupils are equal, round,  and reactive to light.  Neck: Normal range of motion. Neck supple.  Cardiovascular: Normal rate, regular rhythm and normal heart sounds.   Pulmonary/Chest: Effort normal and breath sounds normal. No respiratory distress. She has no wheezes. She has no rales. She exhibits no tenderness.  Abdominal: Soft. Bowel sounds are normal. She exhibits no distension. There is no tenderness. There is no rebound and no guarding.  Musculoskeletal: Normal range of motion. She exhibits no edema.  Mild tenderness of the right lower ribs; no crepitus or deformity, no rashes  No calf tenderness  Lymphadenopathy:    She has no cervical adenopathy.  Neurological: She is alert and oriented to person, place, and time.  Skin: Skin is warm and dry. No rash noted.  Psychiatric: She has a normal mood and affect. Judgment normal.  Nursing note and vitals reviewed.   ED Course  Procedures   DIAGNOSTIC STUDIES:  Oxygen Saturation is 100% on RA, normal by my interpretation.    COORDINATION OF CARE:  7:11 PM Discussed treatment plan with pt at bedside and pt agreed to plan.  Labs Review Results for orders placed or performed during the hospital encounter of 06/28/16  Urinalysis, Routine w reflex microscopic  Result Value Ref Range   Color, Urine YELLOW YELLOW   APPearance CLEAR CLEAR   Specific Gravity, Urine 1.026 1.005 - 1.030   pH 7.5 5.0 - 8.0   Glucose, UA NEGATIVE NEGATIVE mg/dL   Hgb urine dipstick TRACE (A) NEGATIVE   Bilirubin Urine NEGATIVE NEGATIVE   Ketones, ur 15 (A) NEGATIVE mg/dL   Protein, ur NEGATIVE NEGATIVE mg/dL   Nitrite NEGATIVE NEGATIVE   Leukocytes, UA SMALL (A) NEGATIVE  Pregnancy, urine  Result Value Ref Range   Preg Test, Ur NEGATIVE NEGATIVE  Urine microscopic-add on  Result Value Ref Range   Squamous Epithelial / LPF 0-5 (A) NONE SEEN   WBC, UA 6-30 0 - 5 WBC/hpf   RBC / HPF 0-5 0 - 5 RBC/hpf   Bacteria, UA MANY (A) NONE SEEN   Urine-Other MUCOUS PRESENT    Dg Chest 2  View  06/28/2016  CLINICAL DATA:  Acute nonproductive cough, right rib pain EXAM: CHEST  2 VIEW COMPARISON:  02/28/2014 FINDINGS: The heart size and mediastinal contours are within normal limits. Both lungs are clear. The visualized skeletal structures are unremarkable. IMPRESSION: No active cardiopulmonary disease. Electronically Signed   By: Judie Petit.  Shick M.D.   On: 06/28/2016 20:23     I have personally reviewed and evaluated these images and lab results as part of my medical decision-making.    MDM   Final diagnoses:  URI (upper respiratory infection)    Patient presents with cough and right rib pain. She doesn't have pleuritic symptoms. She has no hypoxia or tachycardia. She's afebrile. There is no evidence of pneumonia. She doesn't have any symptoms that would be more suggestive of pulmonary embolus. She has some bacteria in her urine and small leukocytes however she's asymptomatic for UTI. I did sent for culture but I did not start  her on antibiotics. I feel that her symptoms are consistent with a viral URI and musculoskeletal chest wall pain. She was discharged home in good condition. She was given a prescription for [employer]. She was advised to use ibuprofen for symptomatic relief of her rib pain. She was advised return if she has worsening symptoms.  I personally performed the services described in this documentation, which was scribed in my presence.  The recorded information has been reviewed and considered.    Rolan BuccoMelanie Sean Malinowski, MD 06/28/16 [id number]

## 2016-06-28 NOTE — ED Notes (Signed)
Pt. returned from XR. 

## 2016-06-28 NOTE — ED Notes (Signed)
Patient states that she has had a nonproductive cough x 1 week. Reports having pain at the bottom of her left ribs with movement and palpation. Denies SOB, N/V.

## 2016-06-28 NOTE — ED Notes (Signed)
Patient states concerns about possible HIV infection. Reports being diagnosed with chlamydia last year after intercourse with her current partner. States she nor her partner have ever been tested. Informed patient that she and her partner could call the health department to schedule HIV testing.

## 2016-06-30 LAB — URINE CULTURE
Culture: <10000 — AB
Special Requests: NORMAL

## 2019-02-19 ENCOUNTER — Emergency Department (HOSPITAL_BASED_OUTPATIENT_CLINIC_OR_DEPARTMENT_OTHER)
Admission: EM | Admit: 2019-02-19 | Discharge: 2019-02-20 | Disposition: A | Discharge status: Home / Self Care | Payer: Self-pay | Attending: Emergency Medicine | Admitting: Emergency Medicine

## 2019-02-19 ENCOUNTER — Other Ambulatory Visit: Payer: Self-pay

## 2019-02-19 ENCOUNTER — Encounter (HOSPITAL_BASED_OUTPATIENT_CLINIC_OR_DEPARTMENT_OTHER): Payer: Self-pay

## 2019-02-19 ENCOUNTER — Emergency Department (HOSPITAL_BASED_OUTPATIENT_CLINIC_OR_DEPARTMENT_OTHER): Payer: Self-pay

## 2019-02-19 ENCOUNTER — Encounter (HOSPITAL_BASED_OUTPATIENT_CLINIC_OR_DEPARTMENT_OTHER): Payer: Self-pay | Admitting: Emergency Medicine

## 2019-02-19 ENCOUNTER — Emergency Department (HOSPITAL_BASED_OUTPATIENT_CLINIC_OR_DEPARTMENT_OTHER)
Admission: EM | Admit: 2019-02-19 | Discharge: 2019-02-19 | Disposition: A | Discharge status: Home / Self Care | Payer: BLUE CROSS/BLUE SHIELD | Attending: Emergency Medicine | Admitting: Emergency Medicine

## 2019-02-19 DIAGNOSIS — R197 Diarrhea, unspecified: Secondary | ICD-10-CM | POA: Insufficient documentation

## 2019-02-19 DIAGNOSIS — Z79899 Other long term (current) drug therapy: Secondary | ICD-10-CM | POA: Insufficient documentation

## 2019-02-19 DIAGNOSIS — K529 Noninfective gastroenteritis and colitis, unspecified: Secondary | ICD-10-CM | POA: Insufficient documentation

## 2019-02-19 LAB — COMPREHENSIVE METABOLIC PANEL
ALT: 10 U/L (ref 0–44)
AST: 18 U/L (ref 15–41)
Albumin: 3.4 g/dL — ABNORMAL LOW (ref 3.5–5.0)
Alkaline Phosphatase: 54 U/L (ref 38–126)
Anion gap: 7 (ref 5–15)
BILIRUBIN TOTAL: 0.3 mg/dL (ref 0.3–1.2)
BUN: 11 mg/dL (ref 6–20)
CHLORIDE: 101 mmol/L (ref 98–111)
CO2: 22 mmol/L (ref 22–32)
Calcium: 8.9 mg/dL (ref 8.9–10.3)
Creatinine, Ser: 0.65 mg/dL (ref 0.44–1.00)
Glucose, Bld: 133 mg/dL — ABNORMAL HIGH (ref 70–99)
Potassium: 3.6 mmol/L (ref 3.5–5.1)
Sodium: 130 mmol/L — ABNORMAL LOW (ref 135–145)
Total Protein: 7 g/dL (ref 6.5–8.1)

## 2019-02-19 LAB — CBC WITH DIFFERENTIAL/PLATELET
ABS IMMATURE GRANULOCYTES: 0.02 10*3/uL (ref 0.00–0.07)
Basophils Absolute: 0 10*3/uL (ref 0.0–0.1)
Basophils Relative: 0 %
Eosinophils Absolute: 0 10*3/uL (ref 0.0–0.5)
Eosinophils Relative: 0 %
HEMATOCRIT: 38.9 % (ref 36.0–46.0)
HEMOGLOBIN: 11.9 g/dL — AB (ref 12.0–15.0)
Immature Granulocytes: 0 %
LYMPHS ABS: 1.4 10*3/uL (ref 0.7–4.0)
LYMPHS PCT: 15 %
MCH: 25.8 pg — ABNORMAL LOW (ref 26.0–34.0)
MCHC: 30.6 g/dL (ref 30.0–36.0)
MCV: 84.2 fL (ref 80.0–100.0)
MONOS PCT: 5 %
Monocytes Absolute: 0.4 10*3/uL (ref 0.1–1.0)
NEUTROS ABS: 7.3 10*3/uL (ref 1.7–7.7)
Neutrophils Relative %: 80 %
PLATELETS: 318 10*3/uL (ref 150–400)
RBC: 4.62 MIL/uL (ref 3.87–5.11)
RDW: 14.4 % (ref 11.5–15.5)
WBC: 9.1 10*3/uL (ref 4.0–10.5)
nRBC: 0 % (ref 0.0–0.2)

## 2019-02-19 LAB — C DIFFICILE QUICK SCREEN W PCR REFLEX
C DIFFICILE (CDIFF) TOXIN: NEGATIVE
C Diff antigen: NEGATIVE
C Diff interpretation: NOT DETECTED

## 2019-02-19 LAB — LIPASE, BLOOD: Lipase: 21 U/L (ref 11–51)

## 2019-02-19 MED ORDER — SODIUM CHLORIDE 0.9 % IV BOLUS
500.0000 mL | Freq: Once | INTRAVENOUS | Status: AC
Start: 2019-02-19 — End: 2019-02-20
  Administered 2019-02-19: 500 mL via INTRAVENOUS

## 2019-02-19 MED ORDER — IOHEXOL 300 MG/ML  SOLN
100.0000 mL | Freq: Once | INTRAMUSCULAR | Status: AC | PRN
  Administered 2019-02-19: 100 mL via INTRAVENOUS

## 2019-02-19 MED ORDER — FENTANYL CITRATE (PF) 100 MCG/2ML IJ SOLN
50.0000 ug | Freq: Once | INTRAMUSCULAR | Status: AC
  Administered 2019-02-19: 50 ug via INTRAVENOUS
  Filled 2019-02-19: qty 2

## 2019-02-19 NOTE — ED Notes (Signed)
ED Provider at bedside. 

## 2019-02-19 NOTE — Discharge Instructions (Addendum)
You were seen in the emergency department for abdominal pain and diarrhea.  Please STOP your antibiotic.  Please continue to take Mucinex.  Humidified air help soothe your throat.  A spoonful of honey in lemon tea may help decrease coughing at night.  You gave a stool sample today which we will test for bacteria. Please take a probiotic such as Activia yogurt to help with your symptoms.  If your diarrhea does not resolve, if you are unable to stay hydrated by mouth, or if you develop as of breath, these would be reasons to seek medical attention.

## 2019-02-19 NOTE — ED Triage Notes (Signed)
Reports seen at [facility] for URI.  Given abx-states abx are now causing her to be nauseated.  Additionally reports blood in stool.

## 2019-02-19 NOTE — ED Notes (Signed)
Pt informed we need a stool sample if she has to use the restroom.

## 2019-02-19 NOTE — ED Provider Notes (Signed)
[facility] EMERGENCY DEPARTMENT Provider Note   CSN: [id number] Arrival date & time: 02/19/19  2155    History   Chief Complaint Chief Complaint  Patient presents with  . Abdominal Pain    HPI Ercelle Compton is a 23 y.o. female.     Patient is a 23 year old female who presents with abdominal pain.  She was seen here earlier today for diarrhea.  She started having some crampy lower abdominal pain yesterday with some foul-smelling gas.  This was followed by watery diarrhea and subsequently bloody diarrhea.  She states now her diarrhea is more orange-colored.  However since she was seen here earlier today her abdominal pain is worsening and she is having intense abdominal pain throughout her mid abdomen.  She had some nausea but no vomiting.  No known fevers.  She recently was treated with Augmentin for URI symptoms.  She subsequently has stopped this.     Past Medical History:  Diagnosis Date  . Anemia   . GERD (gastroesophageal reflux disease)     There are no active problems to display for this patient.   History reviewed. No pertinent surgical history.   OB History   No obstetric history on file.      Home Medications    Prior to Admission medications   Medication Sig Start Date End Date Taking? Authorizing Provider  benzonatate (TESSALON) 100 MG capsule Take 1 capsule (100 mg total) by mouth every 8 (eight) hours. 06/28/16   Rolan Bucco, MD  ferrous fumarate (HEMOCYTE - 106 MG FE) 325 (106 FE) MG TABS tablet Take 1 tablet by mouth.    [provider]  fluticasone (FLONASE) 50 MCG/ACT nasal spray Place 2 sprays into both nostrils daily. 08/29/15   Everlene Farrier, PA-C  HYDROcodone-acetaminophen (NORCO/VICODIN) 5-325 MG tablet Take 1-2 tablets by mouth every 4 (four) hours as needed. 02/19/19   Rolan Bucco, MD  ibuprofen (ADVIL,MOTRIN) 800 MG tablet Take 1 tablet (800 mg total) by mouth every 8 (eight) hours as needed. 05/28/16   Lawyer,  Cristal Deer, PA-C  naproxen (NAPROSYN) 500 MG tablet Take 1 tablet (500 mg total) by mouth 2 (two) times daily. 05/24/16   Kirichenko, Lemont Fillers, PA-C  ondansetron (ZOFRAN ODT) 4 MG disintegrating tablet 4mg  ODT q4 hours prn nausea/vomit 02/20/19   Rolan Bucco, MD  phenazopyridine (PYRIDIUM) 200 MG tablet Take 1 tablet (200 mg total) by mouth 3 (three) times daily. 05/28/16   Charlestine Night, PA-C  PRESCRIPTION MEDICATION Birth Control pills    [provider]  ranitidine (ZANTAC) 150 MG tablet Take 1 tablet (150 mg total) by mouth 2 (two) times daily. 05/23/16   Gwyneth Sprout, MD  triamcinolone (NASACORT AQ) 55 MCG/ACT AERO nasal inhaler Place 1 spray into the nose 2 (two) times daily. 11/29/15 12/03/15  Gerhard Munch, MD    Family History No family history on file.  Social History Social History   Tobacco Use  . Smoking status: Never Smoker  . Smokeless tobacco: Never Used  Substance Use Topics  . Alcohol use: No  . Drug use: No     Allergies   Ciprofloxacin   Review of Systems Review of Systems  Constitutional: Negative for chills, diaphoresis, fatigue and fever.  HENT: Negative for congestion, rhinorrhea and sneezing.   Eyes: Negative.   Respiratory: Negative for cough, chest tightness and shortness of breath.   Cardiovascular: Negative for chest pain and leg swelling.  Gastrointestinal: Positive for abdominal pain, blood in stool, diarrhea and nausea. Negative  for vomiting.  Genitourinary: Negative for difficulty urinating, flank pain, frequency and hematuria.  Musculoskeletal: Negative for arthralgias and back pain.  Skin: Negative for rash.  Neurological: Negative for dizziness, speech difficulty, weakness, numbness and headaches.     Physical Exam Updated Vital Signs BP 101/61   Pulse 71   Temp 98.4 F (36.9 C) (Oral)   Resp 16   Ht  (1.676 m)   Wt 69.9 kg   LMP 01/28/2019 (Approximate) Comment: signed u-preg waiver  SpO2 100%   BMI  24.86 kg/m   Physical Exam Constitutional:      Appearance: She is well-developed.  HENT:     Head: Normocephalic and atraumatic.  Eyes:     Pupils: Pupils are equal, round, and reactive to light.  Neck:     Musculoskeletal: Normal range of motion and neck supple.  Cardiovascular:     Rate and Rhythm: Normal rate and regular rhythm.     Heart sounds: Normal heart sounds.  Pulmonary:     Effort: Pulmonary effort is normal. No respiratory distress.     Breath sounds: Normal breath sounds. No wheezing or rales.  Chest:     Chest wall: No tenderness.  Abdominal:     General: Bowel sounds are normal.     Palpations: Abdomen is soft.     Tenderness: There is generalized abdominal tenderness. There is no guarding or rebound.  Musculoskeletal: Normal range of motion.  Lymphadenopathy:     Cervical: No cervical adenopathy.  Skin:    General: Skin is warm and dry.     Findings: No rash.  Neurological:     Mental Status: She is alert and oriented to person, place, and time.      ED Treatments / Results  Labs (all labs ordered are listed, but only abnormal results are displayed) Labs Reviewed  COMPREHENSIVE METABOLIC PANEL - Abnormal; Notable for the following components:      Result Value   Sodium 130 (*)    Glucose, Bld 133 (*)    Albumin 3.4 (*)    All other components within normal limits  CBC WITH DIFFERENTIAL/PLATELET - Abnormal; Notable for the following components:   Hemoglobin 11.9 (*)    MCH 25.8 (*)    All other components within normal limits  GASTROINTESTINAL PANEL BY PCR, STOOL (REPLACES STOOL CULTURE)  LIPASE, BLOOD    EKG None  Radiology Ct Abdomen Pelvis W Contrast  Result Date: 02/19/2019 CLINICAL DATA:  Umbilical region abdominal pain and tenderness with diarrhea since yesterday. Blood in stools. EXAM: CT ABDOMEN AND PELVIS WITH CONTRAST TECHNIQUE: Multidetector CT imaging of the abdomen and pelvis was performed using the standard protocol following  bolus administration of intravenous contrast. CONTRAST:  OMNIPAQUE IOHEXOL 300 MG/ML  SOLN COMPARISON:  07/04/2015 FINDINGS: Lower chest: Lung bases are clear. Hepatobiliary: No focal liver abnormality is seen. No gallstones, gallbladder wall thickening, or biliary dilatation. Pancreas: Unremarkable. No pancreatic ductal dilatation or surrounding inflammatory changes. Spleen: Normal in size without focal abnormality. Adrenals/Urinary Tract: Adrenal glands are unremarkable. Kidneys are normal, without renal calculi, focal lesion, or hydronephrosis. Bladder is unremarkable. Stomach/Bowel: Stomach, small bowel, and colon are not abnormally distended. There is diffuse wall thickening and edema throughout the colon, most prominent in the right and transverse colon. There is mild inflammatory infiltration in the fat around the colon. This is consistent with infectious or inflammatory colitis. Appearance suggest most likely diagnosis of pseudomembranous colitis. Appendix is normal. Vascular/Lymphatic: No significant vascular findings  are present. No enlarged abdominal or pelvic lymph nodes. Reproductive: Uterus and bilateral adnexa are unremarkable. Other: No free air or free fluid in the abdomen. Abdominal wall musculature appears intact. Musculoskeletal: No acute or significant osseous findings. IMPRESSION: Diffuse colonic wall thickening and pericolonic infiltration consistent with infectious or inflammatory colitis. Consider pseudomembranous colitis. Electronically Signed   By: Burman Nieves M.D.   On: 02/19/2019 23:45    Procedures Procedures (including critical care time)  Medications Ordered in ED Medications  fentaNYL (SUBLIMAZE) injection 50 mcg (50 mcg Intravenous Given 02/19/19 2235)  sodium chloride 0.9 % bolus 500 mL (500 mLs Intravenous New Bag/Given 02/19/19 2235)  iohexol (OMNIPAQUE) 300 MG/ML solution 100 mL (100 mLs Intravenous Contrast Given 02/19/19 2326)     Initial Impression /  Assessment and Plan / ED Course  I have reviewed the triage vital signs and the nursing notes.  Pertinent labs & imaging results that were available during my care of the patient were reviewed by me and considered in my medical decision making (see chart for details).        Patient is a 23 year old female who presents with worsening abdominal pain.  She was seen earlier today with diarrhea that was at times bloody.  She did have a C. difficile test today that was negative.  Given her worsening abdominal pain, CT scan was performed which shows diffuse colitis.  She has had no ongoing bleeding in her stool.  Her pain is markedly improved after 1 dose of fentanyl in the ED.  She was given IV fluids but does not appear dehydrated.  Her labs are non-concerning.  Her stool was sent for GI pathogen panel.  I feel this point she can be discharged home.  She was advised to use clear liquid diet for the next 24 hours and slowly progressed after that.  She was advised to follow-up with her PCP with [facility] in 2 days.  She was given strict return precautions.  Final Clinical Impressions(s) / ED Diagnoses   Final diagnoses:  Colitis    ED Discharge Orders         Ordered    ondansetron (ZOFRAN ODT) 4 MG disintegrating tablet     02/20/19 0000    HYDROcodone-acetaminophen (NORCO/VICODIN) 5-325 MG tablet  Every 4 hours PRN     02/20/19 0000    ondansetron (ZOFRAN ODT) 4 MG disintegrating tablet  Status:  Discontinued     02/20/19 0000           Rolan Bucco, MD 02/20/19 0003

## 2019-02-19 NOTE — ED Triage Notes (Addendum)
Pt c/o cont'd abd pain, diarrhea -states she was seen here earlier for same-NAD-steady gait

## 2019-02-19 NOTE — ED Provider Notes (Signed)
[facility] EMERGENCY DEPARTMENT Provider Note   CSN: [id number] Arrival date & time: 02/19/19  1317    History   Chief Complaint Chief Complaint  Patient presents with  . Abdominal Pain    HPI Diamond Williams is a 23 y.o. female presenting for abdominal discomfort and diarrhea for the past day.  Patient was in her usual state of health until 3/9 when she presented to urgent care for a URI and sinus pain.  She was treated with Augmentin for an acute sinusitis.  She returned to urgent care the next day with continued URI symptoms and was given a Depo-Medrol injection.  Subsequentially she reports that she developed crampy diffuse abdominal pain yesterday evening.  This morning she had 3 episodes of diarrhea.  She reports that she did notice bright red blood per rectum.  Her abdominal pain is nonfocal.  She has not had any fevers.  She continues to have rhinorrhea and congestion but no sinus pain.  She does not have any throat pain or ear pain.  She does not have any shortness of breath or chest pain.  She is not having any urinary symptoms.  She has never had similar symptoms in the past.  Does not have any sick contacts at home with similar symptoms.    HPI  Past Medical History:  Diagnosis Date  . Anemia   . GERD (gastroesophageal reflux disease)     There are no active problems to display for this patient.   History reviewed. No pertinent surgical history.   OB History   No obstetric history on file.      Home Medications    Prior to Admission medications   Medication Sig Start Date End Date Taking? Authorizing Provider  benzonatate (TESSALON) 100 MG capsule Take 1 capsule (100 mg total) by mouth every 8 (eight) hours. 06/28/16   Rolan Bucco, MD  ferrous fumarate (HEMOCYTE - 106 MG FE) 325 (106 FE) MG TABS tablet Take 1 tablet by mouth.    [provider]  fluticasone (FLONASE) 50 MCG/ACT nasal spray Place 2 sprays into both nostrils daily. 08/29/15    Everlene Farrier, PA-C  ibuprofen (ADVIL,MOTRIN) 800 MG tablet Take 1 tablet (800 mg total) by mouth every 8 (eight) hours as needed. 05/28/16   Lawyer, Cristal Deer, PA-C  naproxen (NAPROSYN) 500 MG tablet Take 1 tablet (500 mg total) by mouth 2 (two) times daily. 05/24/16   Kirichenko, Lemont Fillers, PA-C  phenazopyridine (PYRIDIUM) 200 MG tablet Take 1 tablet (200 mg total) by mouth 3 (three) times daily. 05/28/16   Charlestine Night, PA-C  PRESCRIPTION MEDICATION Birth Control pills    [provider]  ranitidine (ZANTAC) 150 MG tablet Take 1 tablet (150 mg total) by mouth 2 (two) times daily. 05/23/16   Gwyneth Sprout, MD  triamcinolone (NASACORT AQ) 55 MCG/ACT AERO nasal inhaler Place 1 spray into the nose 2 (two) times daily. 11/29/15 12/03/15  Gerhard Munch, MD    Family History History reviewed. No pertinent family history.  Social History Social History   Tobacco Use  . Smoking status: Never Smoker  . Smokeless tobacco: Never Used  Substance Use Topics  . Alcohol use: No  . Drug use: No     Allergies   Ciprofloxacin   Review of Systems Review of Systems  Constitutional: Negative for chills, diaphoresis and fatigue.  HENT: Positive for congestion. Negative for ear pain, facial swelling, rhinorrhea, sinus pressure and sore throat.   Respiratory: Positive for cough. Negative for  shortness of breath and wheezing.   Gastrointestinal: Positive for abdominal pain, blood in stool, diarrhea and nausea. Negative for abdominal distention and vomiting.     Physical Exam Updated Vital Signs BP 109/73 (BP Location: Right Arm)   Pulse 80   Temp 98.5 F (36.9 C) (Oral)   Resp 16   Ht  (1.676 m)   Wt 70.8 kg   LMP 01/28/2019 (Approximate)   SpO2 98%   BMI 25.18 kg/m   Physical Exam Constitutional:      General: She is not in acute distress.    Appearance: She is well-developed. She is not ill-appearing, toxic-appearing or diaphoretic.  HENT:     Head:  Normocephalic and atraumatic.     Mouth/Throat:     Mouth: Mucous membranes are moist.     Pharynx: No pharyngeal swelling or oropharyngeal exudate.  Eyes:     Extraocular Movements: Extraocular movements intact.     Pupils: Pupils are equal, round, and reactive to light.  Cardiovascular:     Rate and Rhythm: Normal rate and regular rhythm.     Heart sounds: No murmur. No friction rub. No gallop.   Pulmonary:     Effort: Pulmonary effort is normal.     Breath sounds: Normal breath sounds. No wheezing, rhonchi or rales.  Abdominal:     General: Bowel sounds are normal. There is no distension.     Palpations: Abdomen is soft. There is no hepatomegaly or splenomegaly.     Tenderness: There is no right CVA tenderness or left CVA tenderness. Negative signs include Murphy's sign, Rovsing's sign and McBurney's sign.     Comments: Mild diffuse tenderness without rebound or guarding  Skin:    General: Skin is warm and dry.     Capillary Refill: Capillary refill takes less than 2 seconds.  Neurological:     General: No focal deficit present.     Mental Status: She is alert.  Psychiatric:        Mood and Affect: Mood normal.        Behavior: Behavior normal.      ED Treatments / Results  Labs (all labs ordered are listed, but only abnormal results are displayed) Labs Reviewed  C DIFFICILE QUICK SCREEN W PCR REFLEX    EKG None  Radiology No results found.  Procedures Procedures (including critical care time)  Medications Ordered in ED Medications - No data to display   Initial Impression / Assessment and Plan / ED Course  I have reviewed the triage vital signs and the nursing notes.  Pertinent labs & imaging results that were available during my care of the patient were reviewed by me and considered in my medical decision making (see chart for details).       Abdominal pain and diarrhea - patient noted to have bright red bloody diarrhea, however subsequently abdominal  pain subsided. She remains afebrile and hemodynamically stable.  - Discontinue Augmentin - advised patient to start a probiotic - Cdiff PCR sent, patient will need antibiotics if results are positive - push oral hydration - bland diet, advance as tolerated - follow up precautions were advised  URI - patient now s/p 5 days of augmentin and depomedrol. Recommend DC augmentin as noted above. Conservative management with OTC remedies such as mucinex, humidified air, and tea with honey.   Final Clinical Impressions(s) / ED Diagnoses   Final diagnoses:  Diarrhea, unspecified type    ED Discharge Orders    None  Howard Pouch, MD 02/19/19 1546    Gwyneth Sprout, MD 02/19/19 2135

## 2019-02-20 LAB — GASTROINTESTINAL PANEL BY PCR, STOOL (REPLACES STOOL CULTURE)

## 2019-02-20 MED ORDER — ONDANSETRON 4 MG PO TBDP: ORAL_TABLET | ORAL | 0 refills | Status: DC

## 2019-02-20 MED ORDER — HYDROCODONE-ACETAMINOPHEN 5-325 MG PO TABS: 1.0000 | ORAL_TABLET | ORAL | 0 refills | Status: DC | PRN

## 2019-02-20 NOTE — ED Notes (Signed)
Pt and mother understood dc material. NAD noted. Scripts sent in electronically. All questions answered to satisfaction. Pt and mother escorted to check out desk.

## 2020-10-11 IMAGING — CT CT ABDOMEN AND PELVIS WITH CONTRAST
2 of 4 series · 16 of 46 positions shown, 18 images · IV contrast (APPLIED)
Comparison: 07/04/2015

CLINICAL DATA: Umbilical region abdominal pain and tenderness with
diarrhea since yesterday. Blood in stools.

EXAM:
CT ABDOMEN AND PELVIS WITH CONTRAST
TECHNIQUE: Multidetector CT imaging of the abdomen and pelvis was performed
using the standard protocol following bolus administration of
intravenous contrast.
CONTRAST:  100mL OMNIPAQUE IOHEXOL 300 MG/ML  SOLN

[Series 2: axial st · axial · 0.78mm/px · z∈[-441,-41]mm · 13 of 88 slices shown, 15 images]
[im 4/88  soft-tissue]
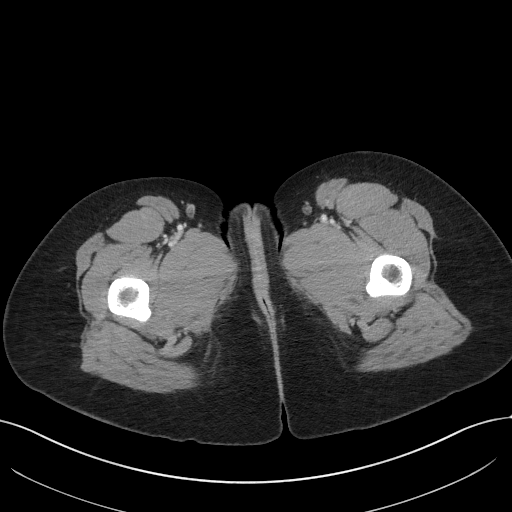
[im 4/88  bone]
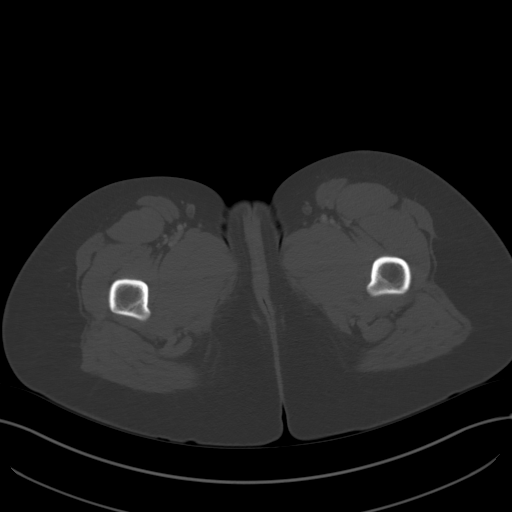
[im 11/88  soft-tissue]
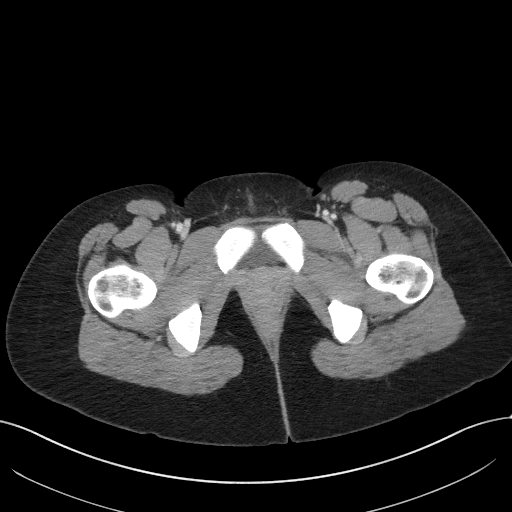
[im 19/88  soft-tissue]
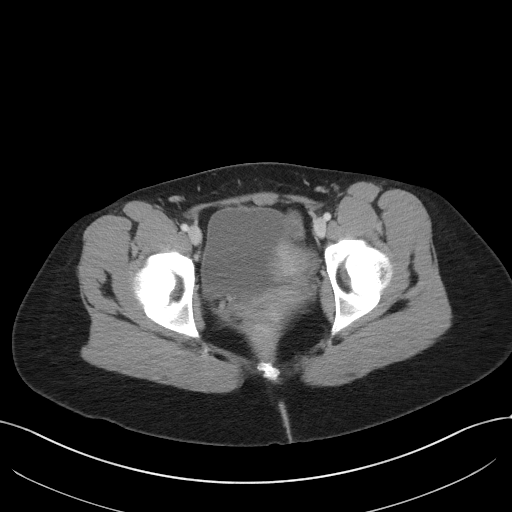
[im 26/88  soft-tissue]
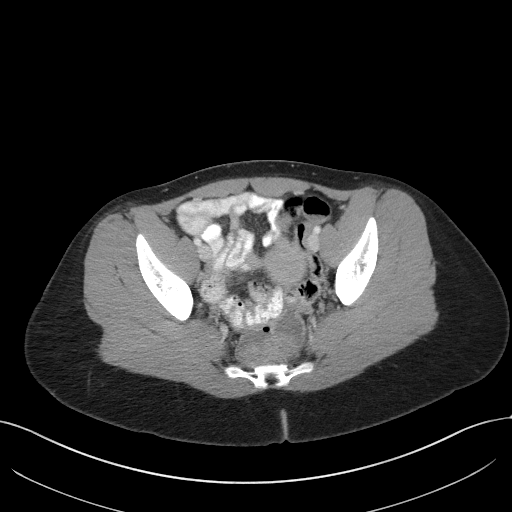
[im 30/88  soft-tissue]
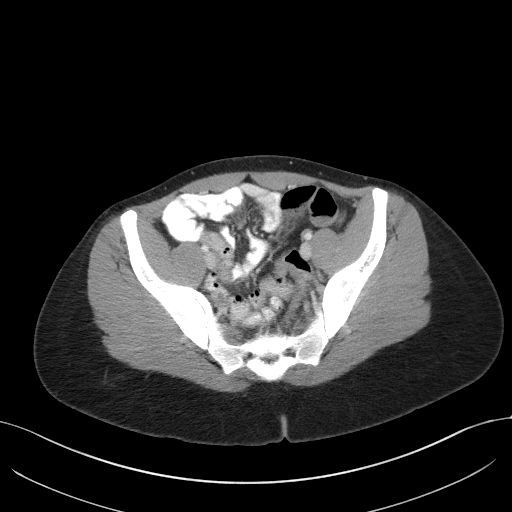
[im 37/88  soft-tissue]
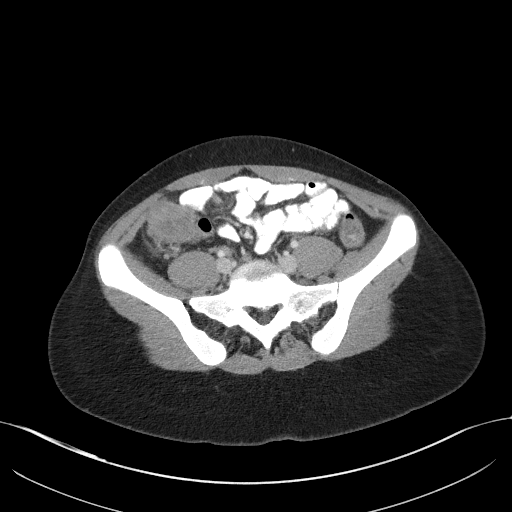
[im 44/88  soft-tissue]
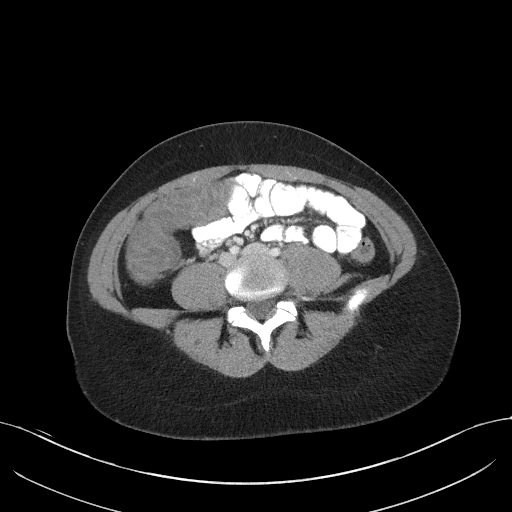
[im 51/88  soft-tissue]
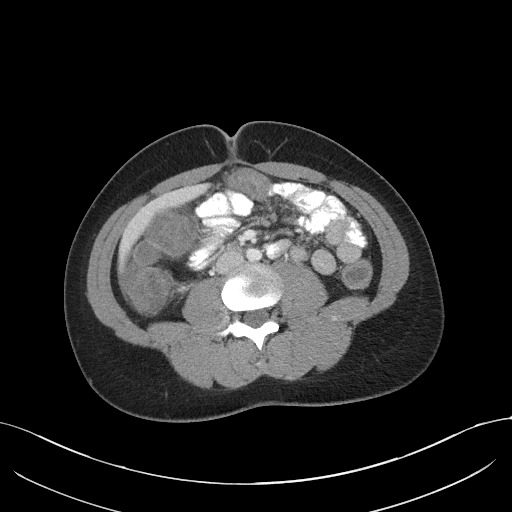
[im 59/88  soft-tissue]
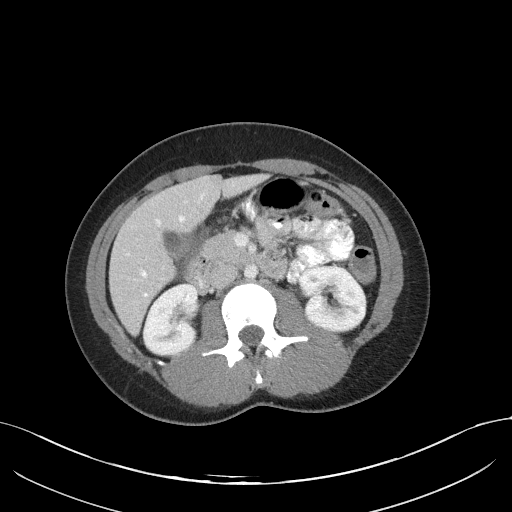
[im 59/88  bone]
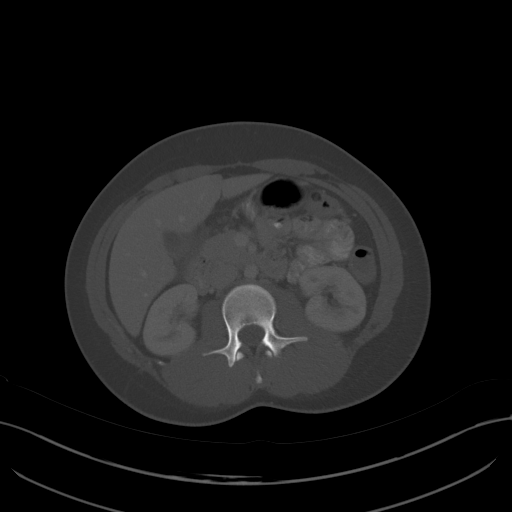
[im 62/88  soft-tissue]
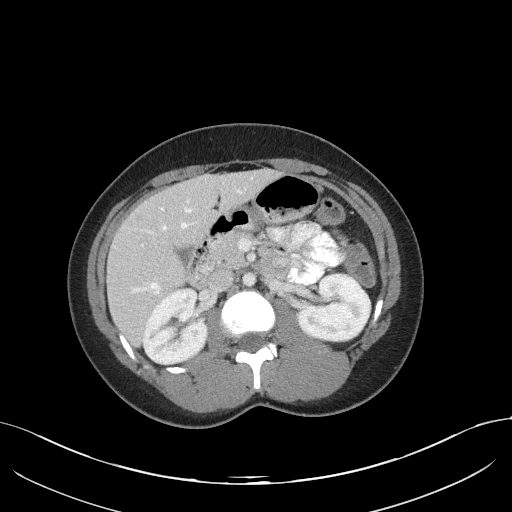
[im 69/88  soft-tissue]
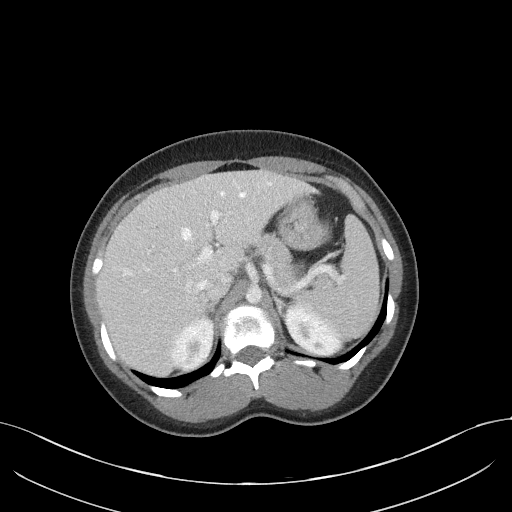
[im 77/88  soft-tissue]
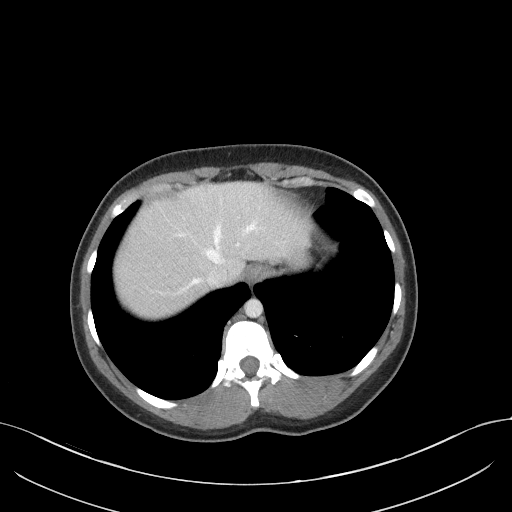
[im 84/88  soft-tissue]
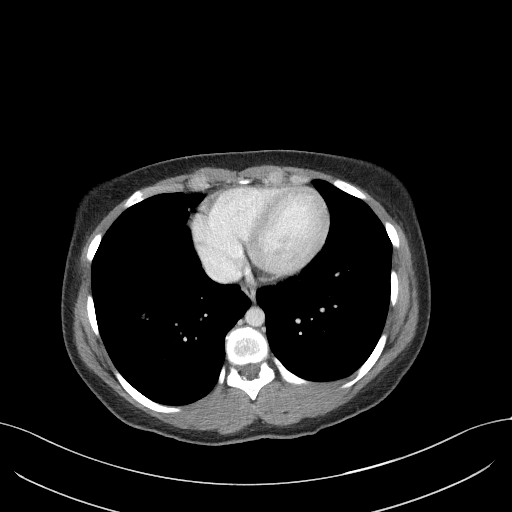

[Series 5: coronal st · coronal · 0.68mm/px · 3 of 84 slices shown]
[im 28/84  soft-tissue]
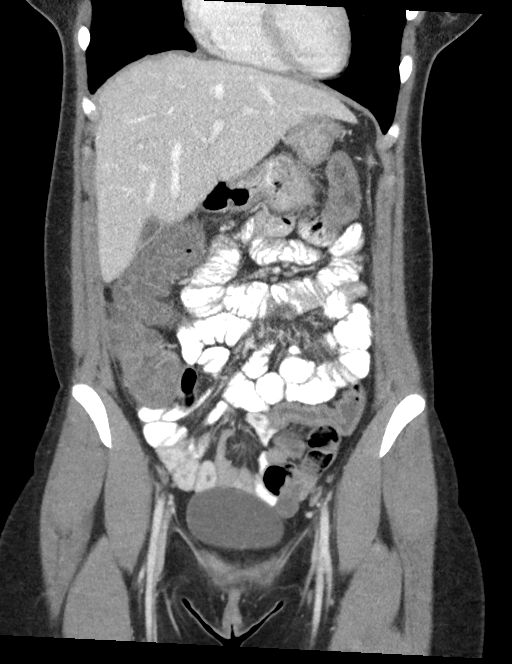
[im 37/84  soft-tissue]
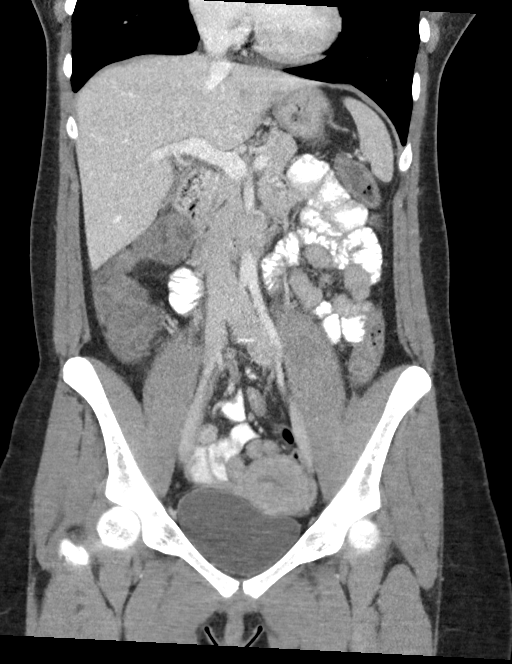
[im 47/84  soft-tissue]
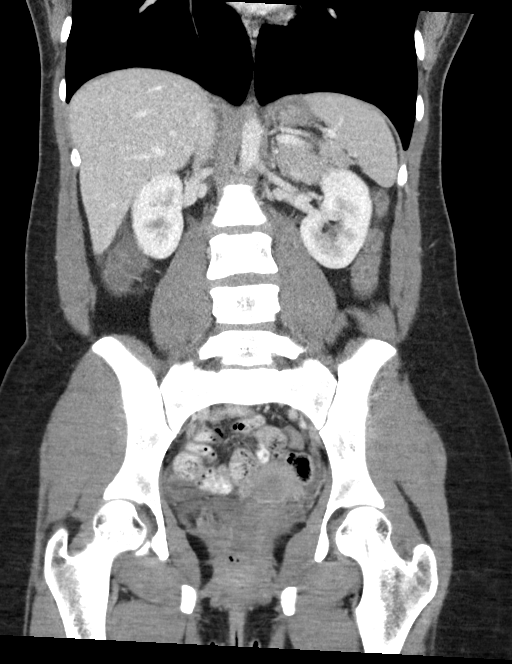

[16 of 46 positions shown; findings below may reference images not displayed]

FINDINGS: Lower chest: Lung bases are clear.

Hepatobiliary: No focal liver abnormality is seen. No gallstones,
gallbladder wall thickening, or biliary dilatation.

Pancreas: Unremarkable. No pancreatic ductal dilatation or
surrounding inflammatory changes.

Spleen: Normal in size without focal abnormality.

Adrenals/Urinary Tract: Adrenal glands are unremarkable. Kidneys are
normal, without renal calculi, focal lesion, or hydronephrosis.
Bladder is unremarkable.

Stomach/Bowel: Stomach, small bowel, and colon are not abnormally
distended. There is diffuse wall thickening and edema throughout the
colon, most prominent in the right and transverse colon. There is
mild inflammatory infiltration in the fat around the colon. This is
consistent with infectious or inflammatory colitis. Appearance
suggest most likely diagnosis of pseudomembranous colitis. Appendix
is normal.

Vascular/Lymphatic: No significant vascular findings are present. No
enlarged abdominal or pelvic lymph nodes.

Reproductive: Uterus and bilateral adnexa are unremarkable.

Other: No free air or free fluid in the abdomen. Abdominal wall
musculature appears intact.

Musculoskeletal: No acute or significant osseous findings.
IMPRESSION: Diffuse colonic wall thickening and pericolonic infiltration
consistent with infectious or inflammatory colitis. Consider
pseudomembranous colitis.

## 2021-03-24 ENCOUNTER — Encounter (HOSPITAL_BASED_OUTPATIENT_CLINIC_OR_DEPARTMENT_OTHER): Payer: Self-pay | Admitting: Emergency Medicine

## 2021-03-24 ENCOUNTER — Emergency Department (HOSPITAL_BASED_OUTPATIENT_CLINIC_OR_DEPARTMENT_OTHER)
Admission: EM | Admit: 2021-03-24 | Discharge: 2021-03-24 | Disposition: A | Discharge status: Home / Self Care | Payer: Self-pay | Attending: Emergency Medicine | Admitting: Emergency Medicine

## 2021-03-24 ENCOUNTER — Other Ambulatory Visit: Payer: Self-pay

## 2021-03-24 DIAGNOSIS — K219 Gastro-esophageal reflux disease without esophagitis: Secondary | ICD-10-CM | POA: Insufficient documentation

## 2021-03-24 DIAGNOSIS — K2900 Acute gastritis without bleeding: Secondary | ICD-10-CM | POA: Insufficient documentation

## 2021-03-24 HISTORY — DX: Nonscarring hair loss, unspecified: L65.9

## 2021-03-24 LAB — COMPREHENSIVE METABOLIC PANEL
ALT: 12 U/L (ref 0–44)
AST: 20 U/L (ref 15–41)
Albumin: 3.3 g/dL — ABNORMAL LOW (ref 3.5–5.0)
Alkaline Phosphatase: 57 U/L (ref 38–126)
Anion gap: 10 (ref 5–15)
BUN: 11 mg/dL (ref 6–20)
CO2: 20 mmol/L — ABNORMAL LOW (ref 22–32)
Calcium: 8.2 mg/dL — ABNORMAL LOW (ref 8.9–10.3)
Chloride: 102 mmol/L (ref 98–111)
Creatinine, Ser: 0.73 mg/dL (ref 0.44–1.00)
GFR, Estimated: >60 mL/min (ref >60)
Glucose, Bld: 113 mg/dL — ABNORMAL HIGH (ref 70–99)
Potassium: 3.7 mmol/L (ref 3.5–5.1)
Sodium: 132 mmol/L — ABNORMAL LOW (ref 135–145)
Total Bilirubin: 0.1 mg/dL — ABNORMAL LOW (ref 0.3–1.2)
Total Protein: 7 g/dL (ref 6.5–8.1)

## 2021-03-24 LAB — CBC WITH DIFFERENTIAL/PLATELET
Abs Immature Granulocytes: 0.02 10*3/uL (ref 0.00–0.07)
Basophils Absolute: 0 10*3/uL (ref 0.0–0.1)
Basophils Relative: 0 %
Eosinophils Absolute: 0 10*3/uL (ref 0.0–0.5)
Eosinophils Relative: 0 %
HCT: 37.4 % (ref 36.0–46.0)
Hemoglobin: 12.4 g/dL (ref 12.0–15.0)
Immature Granulocytes: 0 %
Lymphocytes Relative: 10 %
Lymphs Abs: 0.5 10*3/uL — ABNORMAL LOW (ref 0.7–4.0)
MCH: 27.3 pg (ref 26.0–34.0)
MCHC: 33.2 g/dL (ref 30.0–36.0)
MCV: 82.4 fL (ref 80.0–100.0)
Monocytes Absolute: 0.2 10*3/uL (ref 0.1–1.0)
Monocytes Relative: 4 %
Neutro Abs: 4.1 10*3/uL (ref 1.7–7.7)
Neutrophils Relative %: 86 %
Platelets: 269 10*3/uL (ref 150–400)
RBC: 4.54 MIL/uL (ref 3.87–5.11)
RDW: 14.3 % (ref 11.5–15.5)
WBC: 4.8 10*3/uL (ref 4.0–10.5)
nRBC: 0 % (ref 0.0–0.2)

## 2021-03-24 LAB — URINALYSIS, MICROSCOPIC (REFLEX)
Squamous Epithelial / HPF: >50 (ref 0–5)
WBC, UA: >50 WBC/hpf (ref 0–5)

## 2021-03-24 LAB — URINALYSIS, ROUTINE W REFLEX MICROSCOPIC
Bilirubin Urine: NEGATIVE
Glucose, UA: NEGATIVE mg/dL
Ketones, ur: NEGATIVE mg/dL
Nitrite: NEGATIVE
Protein, ur: NEGATIVE mg/dL
Specific Gravity, Urine: >1.03 — ABNORMAL HIGH (ref 1.005–1.030)
pH: 6 (ref 5.0–8.0)

## 2021-03-24 LAB — LIPASE, BLOOD: Lipase: 27 U/L (ref 11–51)

## 2021-03-24 LAB — PREGNANCY, URINE: Preg Test, Ur: NEGATIVE

## 2021-03-24 MED ORDER — ONDANSETRON HCL 4 MG/2ML IJ SOLN
4.0000 mg | Freq: Once | INTRAMUSCULAR | Status: AC
  Administered 2021-03-24: 4 mg via INTRAVENOUS
  Filled 2021-03-24: qty 2

## 2021-03-24 MED ORDER — ALUM & MAG HYDROXIDE-SIMETH 200-200-20 MG/5ML PO SUSP
30.0000 mL | Freq: Once | ORAL | Status: AC
  Administered 2021-03-24: 30 mL via ORAL
  Filled 2021-03-24: qty 30

## 2021-03-24 MED ORDER — LIDOCAINE VISCOUS HCL 2 % MT SOLN
15.0000 mL | Freq: Once | OROMUCOSAL | Status: AC
  Administered 2021-03-24: 15 mL via ORAL
  Filled 2021-03-24: qty 15

## 2021-03-24 MED ORDER — SODIUM CHLORIDE 0.9 % IV BOLUS
1000.0000 mL | Freq: Once | INTRAVENOUS | Status: AC
  Administered 2021-03-24: 1000 mL via INTRAVENOUS

## 2021-03-24 MED ORDER — MORPHINE SULFATE (PF) 4 MG/ML IV SOLN
4.0000 mg | Freq: Once | INTRAVENOUS | Status: AC
  Administered 2021-03-24: 4 mg via INTRAVENOUS
  Filled 2021-03-24: qty 1

## 2021-03-24 MED ORDER — SUCRALFATE 1 G PO TABS: 1.0000 g | ORAL_TABLET | Freq: Three times a day (TID) | ORAL | 0 refills | Status: AC

## 2021-03-24 MED ORDER — FAMOTIDINE 20 MG PO TABS: 20.0000 mg | ORAL_TABLET | Freq: Two times a day (BID) | ORAL | 5 refills | Status: AC

## 2021-03-24 NOTE — ED Provider Notes (Signed)
[facility] EMERGENCY DEPARTMENT Provider Note   CSN: [id number] Arrival date & time: 03/24/21  0315     History Chief Complaint  Patient presents with  . Abdominal Pain    Diamond Williams is a 25 y.o. female.  Patient presents to the emergency department for evaluation of abdominal pain.  Patient experiencing burning pain across the upper abdomen for 1 day.  She has experienced some hot flashes but has not documented any fevers.  No associated nausea, vomiting or diarrhea.  She reports a history of colitis and GERD, each of which have caused her to have similar symptoms in the past.  No hematemesis, melena or rectal bleeding.        Past Medical History:  Diagnosis Date  . Alopecia   . Anemia   . GERD (gastroesophageal reflux disease)     There are no problems to display for this patient.   History reviewed. No pertinent surgical history.   OB History   No obstetric history on file.     No family history on file.  Social History   Tobacco Use  . Smoking status: Never Smoker  . Smokeless tobacco: Never Used  Vaping Use  . Vaping Use: Never used  Substance Use Topics  . Alcohol use: No  . Drug use: No    Home Medications Prior to Admission medications   Medication Sig Start Date End Date Taking? Authorizing Provider  famotidine (PEPCID) 20 MG tablet Take 1 tablet (20 mg total) by mouth 2 (two) times daily. 03/24/21  Yes Lenoard Helbert, Canary Brim, MD  sucralfate (CARAFATE) 1 g tablet Take 1 tablet (1 g total) by mouth 4 (four) times daily -  with meals and at bedtime. 03/24/21  Yes Annye Forrey, Canary Brim, MD  ethynodiol-ethinyl estradiol (ZOVIA) 1-35 MG-MCG tablet Take 1 tablet by mouth daily. 01/29/21   [provider]    Allergies    Ciprofloxacin  Review of Systems   Review of Systems  Gastrointestinal: Positive for abdominal pain.  All other systems reviewed and are negative.   Physical Exam Updated Vital Signs BP (!) 131/94    Pulse 93   Temp 99.1 F (37.3 C) (Oral)   Resp 16   Ht 5\' 6"  (1.676 m)   Wt 79.4 kg   LMP 02/26/2021 (Approximate)   SpO2 99%   BMI 28.25 kg/m   Physical Exam Vitals and nursing note reviewed.  Constitutional:      General: She is not in acute distress.    Appearance: Normal appearance. She is well-developed.  HENT:     Head: Normocephalic and atraumatic.     Right Ear: Hearing normal.     Left Ear: Hearing normal.     Nose: Nose normal.  Eyes:     Conjunctiva/sclera: Conjunctivae normal.     Pupils: Pupils are equal, round, and reactive to light.  Cardiovascular:     Rate and Rhythm: Regular rhythm.     Heart sounds: S1 normal and S2 normal. No murmur heard. No friction rub. No gallop.   Pulmonary:     Effort: Pulmonary effort is normal. No respiratory distress.     Breath sounds: Normal breath sounds.  Chest:     Chest wall: No tenderness.  Abdominal:     General: Bowel sounds are normal.     Palpations: Abdomen is soft.     Tenderness: There is no abdominal tenderness. There is no guarding or rebound. Negative signs include Murphy's sign and McBurney's sign.  Hernia: No hernia is present.  Musculoskeletal:        General: Normal range of motion.     Cervical back: Normal range of motion and neck supple.  Skin:    General: Skin is warm and dry.     Findings: No rash.  Neurological:     Mental Status: She is alert and oriented to person, place, and time.     GCS: GCS eye subscore is 4. GCS verbal subscore is 5. GCS motor subscore is 6.     Cranial Nerves: No cranial nerve deficit.     Sensory: No sensory deficit.     Coordination: Coordination normal.  Psychiatric:        Speech: Speech normal.        Behavior: Behavior normal.        Thought Content: Thought content normal.     ED Results / Procedures / Treatments   Labs (all labs ordered are listed, but only abnormal results are displayed) Labs Reviewed  URINALYSIS, ROUTINE W REFLEX MICROSCOPIC -  Abnormal; Notable for the following components:      Result Value   APPearance HAZY (*)    Specific Gravity, Urine >1.030 (*)    Hgb urine dipstick SMALL (*)    Leukocytes,Ua SMALL (*)    All other components within normal limits  URINALYSIS, MICROSCOPIC (REFLEX) - Abnormal; Notable for the following components:   Bacteria, UA MANY (*)    All other components within normal limits  CBC WITH DIFFERENTIAL/PLATELET - Abnormal; Notable for the following components:   Lymphs Abs 0.5 (*)    All other components within normal limits  COMPREHENSIVE METABOLIC PANEL - Abnormal; Notable for the following components:   Sodium 132 (*)    CO2 20 (*)    Glucose, Bld 113 (*)    Calcium 8.2 (*)    Albumin 3.3 (*)    Total Bilirubin 0.1 (*)    All other components within normal limits  PREGNANCY, URINE  LIPASE, BLOOD    EKG None  Radiology No results found.  Procedures Procedures   Medications Ordered in ED Medications  sodium chloride 0.9 % bolus 1,000 mL (1,000 mLs Intravenous New Bag/Given 03/24/21 0402)  morphine 4 MG/ML injection 4 mg (4 mg Intravenous Given 03/24/21 0404)  ondansetron (ZOFRAN) injection 4 mg (4 mg Intravenous Given 03/24/21 0402)  alum & mag hydroxide-simeth (MAALOX/MYLANTA) 200-200-20 MG/5ML suspension 30 mL (30 mLs Oral Given 03/24/21 0423)    And  lidocaine (XYLOCAINE) 2 % viscous mouth solution 15 mL (15 mLs Oral Given 03/24/21 0424)    ED Course  I have reviewed the triage vital signs and the nursing notes.  Pertinent labs & imaging results that were available during my care of the patient were reviewed by me and considered in my medical decision making (see chart for details).    MDM Rules/Calculators/A&P                          Patient presents to the emergency department for evaluation of upper abdominal discomfort for 1 day.  Patient reports of burning pain in the center of her upper abdomen.  All symptoms are above the umbilicus, no lower abdominal or  pelvic symptoms.  No urinary symptoms.  Patient does report a history of GERD.  Lab work is very reassuring.  Normal LFTs.  Normal lipase.  Normal white blood cell count.  Urinalysis is abnormal but there are are significant  squamous cells present.  Previous urinalysis with similar findings did not grow pathogens from culture.  Patient was administered GI cocktail with viscous lidocaine and pain has resolved.  Abdominal exam is benign, nontender.  Will treat with antacids, Carafate, follow-up with GI if not improving.  Given return precautions,, discussed signs and symptoms of GI bleeding.  Final Clinical Impression(s) / ED Diagnoses Final diagnoses:  Acute gastritis without hemorrhage, unspecified gastritis type    Rx / DC Orders ED Discharge Orders         Ordered    famotidine (PEPCID) 20 MG tablet  2 times daily        03/24/21 0507    sucralfate (CARAFATE) 1 g tablet  3 times daily with meals & bedtime        03/24/21 0507           Gilda Crease, MD 03/24/21 0510

## 2021-03-24 NOTE — ED Triage Notes (Signed)
Pt c/o burning type pain in upper abd x 1 day with chills and hot flashes. Pt denies fever.
